# Patient Record
Sex: Male | Born: 1965 | Race: White | Hispanic: No | Marital: Single | Smoking: Never smoker
Health system: Southern US, Community
[De-identification: ages and names within clinical notes are randomized; demographics above are authoritative.]

## PROBLEM LIST (undated history)

## (undated) DIAGNOSIS — B029 Zoster without complications: Secondary | ICD-10-CM

## (undated) DIAGNOSIS — K219 Gastro-esophageal reflux disease without esophagitis: Secondary | ICD-10-CM

## (undated) HISTORY — PX: INGUINAL HERNIA REPAIR: SUR1180

## (undated) HISTORY — PX: STOMACH SURGERY: SHX791

## (undated) HISTORY — DX: Zoster without complications: B02.9

---

## 2010-10-18 ENCOUNTER — Emergency Department: Payer: Self-pay | Admitting: Emergency Medicine

## 2012-05-13 ENCOUNTER — Emergency Department: Payer: Self-pay | Admitting: Emergency Medicine

## 2012-11-21 ENCOUNTER — Emergency Department: Payer: Self-pay | Admitting: Emergency Medicine

## 2012-11-21 LAB — COMPREHENSIVE METABOLIC PANEL
Albumin: 4 g/dL (ref 3.4–5.0)
Alkaline Phosphatase: 78 U/L (ref 50–136)
Anion Gap: 5 — ABNORMAL LOW (ref 7–16)
BUN: 12 mg/dL (ref 7–18)
Bilirubin,Total: 0.9 mg/dL (ref 0.2–1.0)
Calcium, Total: 9 mg/dL (ref 8.5–10.1)
Chloride: 104 mmol/L (ref 98–107)
Creatinine: 1.01 mg/dL (ref 0.60–1.30)
EGFR (Non-African Amer.): >60
Glucose: 94 mg/dL (ref 65–99)
Osmolality: 275 (ref 275–301)
SGPT (ALT): 22 U/L (ref 12–78)
Total Protein: 7.3 g/dL (ref 6.4–8.2)

## 2012-11-21 LAB — URINALYSIS, COMPLETE
Bacteria: NONE SEEN
Bilirubin,UR: NEGATIVE
Glucose,UR: NEGATIVE mg/dL (ref 0–75)
Ketone: NEGATIVE
Leukocyte Esterase: NEGATIVE
Nitrite: NEGATIVE
Ph: 5 (ref 4.5–8.0)
Protein: NEGATIVE
RBC,UR: 1 /HPF (ref 0–5)
Specific Gravity: 1.019 (ref 1.003–1.030)
WBC UR: <1 /HPF (ref 0–5)

## 2012-11-21 LAB — CBC
HGB: 14.5 g/dL (ref 13.0–18.0)
MCHC: 35.2 g/dL (ref 32.0–36.0)
Platelet: 242 10*3/uL (ref 150–440)
RDW: 12.6 % (ref 11.5–14.5)
WBC: 5.5 10*3/uL (ref 3.8–10.6)

## 2013-04-09 ENCOUNTER — Emergency Department: Payer: Self-pay | Admitting: Internal Medicine

## 2014-08-11 ENCOUNTER — Emergency Department
Admission: EM | Admit: 2014-08-11 | Discharge: 2014-08-11 | Disposition: A | Discharge status: Home / Self Care | Payer: Self-pay | Attending: Emergency Medicine | Admitting: Emergency Medicine

## 2014-08-11 ENCOUNTER — Encounter: Payer: Self-pay | Admitting: Emergency Medicine

## 2014-08-11 ENCOUNTER — Emergency Department: Payer: Self-pay

## 2014-08-11 DIAGNOSIS — R197 Diarrhea, unspecified: Secondary | ICD-10-CM | POA: Insufficient documentation

## 2014-08-11 DIAGNOSIS — R1110 Vomiting, unspecified: Secondary | ICD-10-CM | POA: Insufficient documentation

## 2014-08-11 DIAGNOSIS — R1084 Generalized abdominal pain: Secondary | ICD-10-CM | POA: Insufficient documentation

## 2014-08-11 DIAGNOSIS — Z720 Tobacco use: Secondary | ICD-10-CM | POA: Insufficient documentation

## 2014-08-11 LAB — CBC WITH DIFFERENTIAL/PLATELET
Basophils Absolute: 0 10*3/uL (ref 0–0.1)
Basophils Relative: 1 %
Eosinophils Absolute: 0.1 10*3/uL (ref 0–0.7)
Eosinophils Relative: 2 %
HCT: 46.9 % (ref 40.0–52.0)
HEMOGLOBIN: 15.9 g/dL (ref 13.0–18.0)
LYMPHS ABS: 0.9 10*3/uL — AB (ref 1.0–3.6)
Lymphocytes Relative: 20 %
MCH: 32.6 pg (ref 26.0–34.0)
MCHC: 33.9 g/dL (ref 32.0–36.0)
MCV: 96.2 fL (ref 80.0–100.0)
MONOS PCT: 10 %
Monocytes Absolute: 0.4 10*3/uL (ref 0.2–1.0)
NEUTROS PCT: 67 %
Neutro Abs: 2.9 10*3/uL (ref 1.4–6.5)
Platelets: 209 10*3/uL (ref 150–440)
RBC: 4.87 MIL/uL (ref 4.40–5.90)
RDW: 12.4 % (ref 11.5–14.5)
WBC: 4.3 10*3/uL (ref 3.8–10.6)

## 2014-08-11 LAB — BASIC METABOLIC PANEL
Anion gap: 8 (ref 5–15)
BUN: 13 mg/dL (ref 6–20)
CALCIUM: 9.3 mg/dL (ref 8.9–10.3)
CO2: 25 mmol/L (ref 22–32)
Chloride: 105 mmol/L (ref 101–111)
Creatinine, Ser: 1.07 mg/dL (ref 0.61–1.24)
GFR calc Af Amer: >60 mL/min (ref >60)
GLUCOSE: 102 mg/dL — AB (ref 65–99)
Potassium: 4.2 mmol/L (ref 3.5–5.1)
Sodium: 138 mmol/L (ref 135–145)

## 2014-08-11 MED ORDER — FAMOTIDINE 20 MG PO TABS
40.0000 mg | ORAL_TABLET | Freq: Once | ORAL | Status: AC
  Administered 2014-08-11: 40 mg via ORAL

## 2014-08-11 MED ORDER — METOCLOPRAMIDE HCL 10 MG PO TABS: 10.0000 mg | ORAL_TABLET | Freq: Three times a day (TID) | ORAL | Status: DC | PRN

## 2014-08-11 MED ORDER — FAMOTIDINE 20 MG PO TABS
ORAL_TABLET | ORAL | Status: AC
  Administered 2014-08-11: 40 mg via ORAL
  Filled 2014-08-11: qty 2

## 2014-08-11 MED ORDER — FAMOTIDINE 40 MG PO TABS: 40.0000 mg | ORAL_TABLET | Freq: Every evening | ORAL | Status: DC

## 2014-08-11 NOTE — Discharge Instructions (Signed)
Abdominal Pain Many things can cause abdominal pain. Usually, abdominal pain is not caused by a disease and will improve without treatment. It can often be observed and treated at home. Your health care provider will do a physical exam and possibly order blood tests and X-rays to help determine the seriousness of your pain. However, in many cases, more time must pass before a clear cause of the pain can be found. Before that point, your health care provider may not know if you need more testing or further treatment. HOME CARE INSTRUCTIONS  Monitor your abdominal pain for any changes. The following actions may help to alleviate any discomfort you are experiencing:  Only take over-the-counter or prescription medicines as directed by your health care provider.  Do not take laxatives unless directed to do so by your health care provider.  Try a clear liquid diet (broth, tea, or water) as directed by your health care provider. Slowly move to a bland diet as tolerated. SEEK MEDICAL CARE IF:  You have unexplained abdominal pain.  You have abdominal pain associated with nausea or diarrhea.  You have pain when you urinate or have a bowel movement.  You experience abdominal pain that wakes you in the night.  You have abdominal pain that is worsened or improved by eating food.  You have abdominal pain that is worsened with eating fatty foods.  You have a fever. SEEK IMMEDIATE MEDICAL CARE IF:   Your pain does not go away within 2 hours.  You keep throwing up (vomiting).  Your pain is felt only in portions of the abdomen, such as the right side or the left lower portion of the abdomen.  You pass bloody or black tarry stools. MAKE SURE YOU:  Understand these instructions.   Will watch your condition.   Will get help right away if you are not doing well or get worse.  Document Released: 12/28/2004 Document Revised: 03/25/2013 Document Reviewed: 11/27/2012 [facility] Patient Information  2015 [place], [place]. This information is not intended to replace advice given to you by your health care provider. Make sure you discuss any questions you have with your health care provider.  Diarrhea Diarrhea is watery poop (stool). It can make you feel weak, tired, thirsty, or give you a dry mouth (signs of dehydration). Watery poop is a sign of another problem, most often an infection. It often lasts 2-3 days. It can last longer if it is a sign of something serious. Take care of yourself as told by your doctor. HOME CARE   Drink 1 cup (8 ounces) of fluid each time you have watery poop.  Do not drink the following fluids:  Those that contain simple sugars (fructose, glucose, galactose, lactose, sucrose, maltose).  Sports drinks.  Fruit juices.  Whole milk products.  Sodas.  Drinks with caffeine (coffee, tea, soda) or alcohol.  Oral rehydration solution may be used if the doctor says it is okay. You may make your own solution. Follow this recipe:   - teaspoon table salt.   teaspoon baking soda.   teaspoon salt substitute containing potassium chloride.  1 tablespoons sugar.  1 liter (34 ounces) of water.  Avoid the following foods:  High fiber foods, such as raw fruits and vegetables.  Nuts, seeds, and whole grain breads and cereals.   Those that are sweetened with sugar alcohols (xylitol, sorbitol, mannitol).  Try eating the following foods:  Starchy foods, such as rice, toast, pasta, low-sugar cereal, oatmeal, baked potatoes, crackers, and bagels.  Bananas.  Applesauce.  Eat probiotic-rich foods, such as yogurt and milk products that are fermented.  Wash your hands well after each time you have watery poop.  Only take medicine as told by your doctor.  Take a warm bath to help lessen burning or pain from having watery poop. GET HELP RIGHT AWAY IF:   You cannot drink fluids without throwing up (vomiting).  You keep throwing up.  You have blood in your  poop, or your poop looks black and tarry.  You do not pee (urinate) in 6-8 hours, or there is only a small amount of very dark pee.  You have belly (abdominal) pain that gets worse or stays in the same spot (localizes).  You are weak, dizzy, confused, or light-headed.  You have a very bad headache.  Your watery poop gets worse or does not get better.  You have a fever or lasting symptoms for more than 2-3 days.  You have a fever and your symptoms suddenly get worse. MAKE SURE YOU:   Understand these instructions.  Will watch your condition.  Will get help right away if you are not doing well or get worse. Document Released: 09/06/2007 Document Revised: 08/04/2013 Document Reviewed: 11/26/2011 [facility] Patient Information 2015 [place], [place]. This information is not intended to replace advice given to you by your health care provider. Make sure you discuss any questions you have with your health care provider.  Nausea and Vomiting Nausea is a sick feeling that often comes before throwing up (vomiting). Vomiting is a reflex where stomach contents come out of your mouth. Vomiting can cause severe loss of body fluids (dehydration). Children and elderly adults can become dehydrated quickly, especially if they also have diarrhea. Nausea and vomiting are symptoms of a condition or disease. It is important to find the cause of your symptoms. CAUSES   Direct irritation of the stomach lining. This irritation can result from increased acid production (gastroesophageal reflux disease), infection, food poisoning, taking certain medicines (such as nonsteroidal anti-inflammatory drugs), alcohol use, or tobacco use.  Signals from the brain.These signals could be caused by a headache, heat exposure, an inner ear disturbance, increased pressure in the brain from injury, infection, a tumor, or a concussion, pain, emotional stimulus, or metabolic problems.  An obstruction in the gastrointestinal tract  (bowel obstruction).  Illnesses such as diabetes, hepatitis, gallbladder problems, appendicitis, kidney problems, cancer, sepsis, atypical symptoms of a heart attack, or eating disorders.  Medical treatments such as chemotherapy and radiation.  Receiving medicine that makes you sleep (general anesthetic) during surgery. DIAGNOSIS Your caregiver may ask for tests to be done if the problems do not improve after a few days. Tests may also be done if symptoms are severe or if the reason for the nausea and vomiting is not clear. Tests may include:  Urine tests.  Blood tests.  Stool tests.  Cultures (to look for evidence of infection).  X-rays or other imaging studies. Test results can help your caregiver make decisions about treatment or the need for additional tests. TREATMENT You need to stay well hydrated. Drink frequently but in small amounts.You may wish to drink water, sports drinks, clear broth, or eat frozen ice pops or gelatin dessert to help stay hydrated.When you eat, eating slowly may help prevent nausea.There are also some antinausea medicines that may help prevent nausea. HOME CARE INSTRUCTIONS   Take all medicine as directed by your caregiver.  If you do not have an appetite, do not force yourself  to eat. However, you must continue to drink fluids.  If you have an appetite, eat a normal diet unless your caregiver tells you differently.  Eat a variety of complex carbohydrates (rice, wheat, potatoes, bread), lean meats, yogurt, fruits, and vegetables.  Avoid high-fat foods because they are more difficult to digest.  Drink enough water and fluids to keep your urine clear or pale yellow.  If you are dehydrated, ask your caregiver for specific rehydration instructions. Signs of dehydration may include:  Severe thirst.  Dry lips and mouth.  Dizziness.  Dark urine.  Decreasing urine frequency and amount.  Confusion.  Rapid breathing or pulse. SEEK IMMEDIATE  MEDICAL CARE IF:   You have blood or brown flecks (like coffee grounds) in your vomit.  You have black or bloody stools.  You have a severe headache or stiff neck.  You are confused.  You have severe abdominal pain.  You have chest pain or trouble breathing.  You do not urinate at least once every 8 hours.  You develop cold or clammy skin.  You continue to vomit for longer than 24 to 48 hours.  You have a fever. MAKE SURE YOU:   Understand these instructions.  Will watch your condition.  Will get help right away if you are not doing well or get worse. Document Released: 03/20/2005 Document Revised: 06/12/2011 Document Reviewed: 08/17/2010 [facility] Patient Information 2015 [place], [place]. This information is not intended to replace advice given to you by your health care provider. Make sure you discuss any questions you have with your health care provider.

## 2014-08-11 NOTE — ED Provider Notes (Addendum)
[facility] Emergency Department Provider Note  ____________________________________________  Time seen: Approximately 11 PM  I have reviewed the triage vital signs and the nursing notes.   HISTORY  Chief Complaint Hematemesis    HPI Jeremy Spence is a 49 y.o. male who presents with 3 episodes of nausea and vomiting this morning. He said his first episode had a small amount of blood streaking. He describes about a teaspoon. He did had 2 more episodes of vomitus without any blood. He is also reporting loose stool, but no blood in the stool. He denies any belly pain. Says that he has had similar episodes of vomiting over the past 3-4 months. However, he says that he usually just vomits once. This morning he became concerned because of 3 episodes of vomiting. He says that he does drink on the weekends but did not drink this past weekend. Does not have a primary care doctor and has not had an endoscopy. [facility] and told that he had "2 linings in his stomach" whereas other people have 3. He said that he had a surgery to repair this because of a mass that was resulted in his left thigh and left lower quadrant.Denies any nausea at this time.   History reviewed. No pertinent past medical history.  There are no active problems to display for this patient.   History reviewed. No pertinent past surgical history.  No current outpatient prescriptions on file.  Allergies Review of patient's allergies indicates not on file.  No family history on file.  Social History History  Substance Use Topics  . Smoking status: Current Every Day Smoker  . Smokeless tobacco: Not on file  . Alcohol Use: Yes    Review of Systems Constitutional: No fever/chills Eyes: No visual changes. ENT: No sore throat. Cardiovascular: Denies chest pain. Respiratory: Denies shortness of breath. Gastrointestinal: No constipation. Genitourinary: Negative for  dysuria. Says has had an intermittent left groin hernia which is able to push back in over the past about 6 months. Musculoskeletal: Negative for back pain. Skin: Negative for rash. Neurological: Negative for headaches, focal weakness or numbness.  10-point ROS otherwise negative.  ____________________________________________   PHYSICAL EXAM:  VITAL SIGNS: ED Triage Vitals  Enc Vitals Group     BP 08/11/14 1002 126/73 mmHg     Pulse Rate 08/11/14 1002 55     Resp 08/11/14 1002 20     Temp 08/11/14 1002 98.2 F (36.8 C)     Temp Source 08/11/14 1002 Oral     SpO2 08/11/14 1002 97 %     Weight 08/11/14 0929 159 lb (72.122 kg)     Height 08/11/14 0929 5\' 5"  (1.651 m)     Head Cir --      Peak Flow --      Pain Score 08/11/14 0930 3     Pain Loc --      Pain Edu? --      Excl. in GC? --     Constitutional: Alert and oriented. Well appearing and in no acute distress. Eyes: Conjunctivae are normal. PERRL. EOMI. Head: Atraumatic. Nose: No congestion/rhinnorhea. Mouth/Throat: Mucous membranes are moist.  Oropharynx non-erythematous. Neck: No stridor.   Cardiovascular: Normal rate, regular rhythm. Grossly normal heart sounds.  Good peripheral circulation. Respiratory: Normal respiratory effort.  No retractions. Lungs CTAB. Gastrointestinal: Soft with mild tenderness to palpation throughout negative Murphy sign. No distention. No abdominal bruits. No CVA tenderness. Genitourinary: Left  groin with small bulge over the direct inguinal area. Soft and easily reducible without any tenderness. Musculoskeletal: No lower extremity tenderness nor edema.  No joint effusions. Neurologic:  Normal speech and language. No gross focal neurologic deficits are appreciated. Speech is normal. No gait instability. Skin:  Skin is warm, dry and intact. No rash noted. Psychiatric: Mood and affect are normal. Speech and behavior are normal.  ____________________________________________   LABS (all  labs ordered are listed, but only abnormal results are displayed)  Labs Reviewed  CBC WITH DIFFERENTIAL/PLATELET - Abnormal; Notable for the following:    Lymphs Abs 0.9 (*)    All other components within normal limits  BASIC METABOLIC PANEL - Abnormal; Notable for the following:    Glucose, Bld 102 (*)    All other components within normal limits   ____________________________________________  EKG   ____________________________________________  RADIOLOGY  Chest x-ray NAD. ____________________________________________   PROCEDURES    ____________________________________________   INITIAL IMPRESSION / ASSESSMENT AND PLAN / ED COURSE  Pertinent labs & imaging results that were available during my care of the patient were reviewed by me and considered in my medical decision making (see chart for details).  Unclear cause of patient's nausea and vomiting and diarrhea. Counseled patient and he will need to follow-up with a primary care doctor for further workup of this. He will likely need an endoscopy because of the persistence of his symptoms. I will start him on an antacid. I will give him follow-up with a true clinic because the patient does not have insurance. I told him also to discuss referral to a surgeon for his hernia with the primary care doctor. His labs are reassuring and the patient is now asymptomatic. ____________________________________________   FINAL CLINICAL IMPRESSION(S) / ED DIAGNOSES  Abdominal pain. Hematemesis, resolved. Acute, initial visit.    Myrna Blazeravid Matthew Jacere Pangborn, MD 08/11/14 1250  Unlikely to be appendicitis or emergent intra-abdominal process at this time. Normal white count. Mild tenderness throughout.  Myrna Blazeravid Matthew Johne Buckle, MD 08/11/14 1254  Patient was counseled to talk with his primary care doctor about further workup for his nausea and vomiting. He will also discuss hernia repair.  Myrna Blazeravid Matthew Kaisey Huseby, MD 08/11/14 1256

## 2014-08-11 NOTE — ED Notes (Signed)
Pt reports that he started vomiting this am and he seen a little bit of blood in it. No N/V seen at this time.

## 2014-09-02 ENCOUNTER — Emergency Department
Admission: EM | Admit: 2014-09-02 | Discharge: 2014-09-02 | Disposition: A | Discharge status: Home / Self Care | Payer: Self-pay | Attending: Emergency Medicine | Admitting: Emergency Medicine

## 2014-09-02 ENCOUNTER — Encounter: Payer: Self-pay | Admitting: Emergency Medicine

## 2014-09-02 DIAGNOSIS — Z720 Tobacco use: Secondary | ICD-10-CM | POA: Insufficient documentation

## 2014-09-02 DIAGNOSIS — Z79899 Other long term (current) drug therapy: Secondary | ICD-10-CM | POA: Insufficient documentation

## 2014-09-02 DIAGNOSIS — K920 Hematemesis: Secondary | ICD-10-CM | POA: Insufficient documentation

## 2014-09-02 LAB — URINALYSIS COMPLETE WITH MICROSCOPIC (ARMC ONLY)
BACTERIA UA: NONE SEEN
Bilirubin Urine: NEGATIVE
Glucose, UA: NEGATIVE mg/dL
HGB URINE DIPSTICK: NEGATIVE
KETONES UR: NEGATIVE mg/dL
Leukocytes, UA: NEGATIVE
Nitrite: NEGATIVE
Protein, ur: 30 mg/dL — AB
SQUAMOUS EPITHELIAL / LPF: NONE SEEN
Specific Gravity, Urine: 1.021 (ref 1.005–1.030)
pH: 7 (ref 5.0–8.0)

## 2014-09-02 LAB — COMPREHENSIVE METABOLIC PANEL
ALT: 19 U/L (ref 17–63)
AST: 26 U/L (ref 15–41)
Albumin: 4.3 g/dL (ref 3.5–5.0)
Alkaline Phosphatase: 77 U/L (ref 38–126)
Anion gap: 8 (ref 5–15)
BILIRUBIN TOTAL: 2.2 mg/dL — AB (ref 0.3–1.2)
BUN: 12 mg/dL (ref 6–20)
CALCIUM: 9.4 mg/dL (ref 8.9–10.3)
CHLORIDE: 103 mmol/L (ref 101–111)
CO2: 28 mmol/L (ref 22–32)
CREATININE: 1.05 mg/dL (ref 0.61–1.24)
GFR calc Af Amer: >60 mL/min (ref >60)
GFR calc non Af Amer: >60 mL/min (ref >60)
GLUCOSE: 105 mg/dL — AB (ref 65–99)
Potassium: 3.8 mmol/L (ref 3.5–5.1)
Sodium: 139 mmol/L (ref 135–145)
Total Protein: 7.7 g/dL (ref 6.5–8.1)

## 2014-09-02 LAB — CBC WITH DIFFERENTIAL/PLATELET
BASOS ABS: 0.1 10*3/uL (ref 0–0.1)
BASOS PCT: 1 %
Eosinophils Absolute: 0.1 10*3/uL (ref 0–0.7)
Eosinophils Relative: 2 %
HCT: 46.3 % (ref 40.0–52.0)
HEMOGLOBIN: 15.8 g/dL (ref 13.0–18.0)
Lymphocytes Relative: 16 %
Lymphs Abs: 0.9 10*3/uL — ABNORMAL LOW (ref 1.0–3.6)
MCH: 32.6 pg (ref 26.0–34.0)
MCHC: 34.2 g/dL (ref 32.0–36.0)
MCV: 95.5 fL (ref 80.0–100.0)
MONO ABS: 0.5 10*3/uL (ref 0.2–1.0)
Monocytes Relative: 9 %
NEUTROS PCT: 72 %
Neutro Abs: 4.2 10*3/uL (ref 1.4–6.5)
Platelets: 231 10*3/uL (ref 150–440)
RBC: 4.85 MIL/uL (ref 4.40–5.90)
RDW: 12.3 % (ref 11.5–14.5)
WBC: 5.7 10*3/uL (ref 3.8–10.6)

## 2014-09-02 LAB — LIPASE, BLOOD: Lipase: 32 U/L (ref 22–51)

## 2014-09-02 MED ORDER — PANTOPRAZOLE SODIUM 40 MG PO TBEC
40.0000 mg | DELAYED_RELEASE_TABLET | Freq: Once | ORAL | Status: AC
  Administered 2014-09-02: 40 mg via ORAL

## 2014-09-02 MED ORDER — OMEPRAZOLE 40 MG PO CPDR: 40.0000 mg | DELAYED_RELEASE_CAPSULE | Freq: Every day | ORAL | Status: DC

## 2014-09-02 MED ORDER — PANTOPRAZOLE SODIUM 40 MG PO TBEC
DELAYED_RELEASE_TABLET | ORAL | Status: AC
  Filled 2014-09-02: qty 1

## 2014-09-02 NOTE — ED Notes (Signed)
States he had 2 episodes of vomiting today with some blood noted

## 2014-09-02 NOTE — Discharge Instructions (Signed)
Hematemesis Stop taking your pepcid/famotidine.  Begin taking omeprazole given to you today.   This condition is the vomiting of blood. CAUSES  This can happen if you have a peptic ulcer or an irritation of the throat, stomach, or small bowel. Vomiting over and over again or swallowing blood from a nosebleed, coughing or facial injury can also result in bloody vomit. Anti-inflammatory pain medicines are a common cause of this potentially dangerous condition. The most serious causes of vomiting blood include:  Ulcers (a bacteria called H. pylori is common cause of ulcers).  Clotting problems.  Alcoholism.  Cirrhosis. TREATMENT  Treatment depends on the cause and the severity of the bleeding. Small amounts of blood streaks in the vomit is not the same as vomiting large amounts of bloody or dark, coffee grounds-like material. Weakness, fainting, dehydration, anemia, and continued alcohol or drug use increase the risk. Examination may include blood, vomit, or stool tests. The presence of bloody or dark stool that tests positive for blood (Hemoccult) means the bleeding has been going on for some time. Endoscopy and imaging studies may be done. Emergency treatment may include:  IV medicines or fluids.  Blood transfusions.  Surgery. Hospital care is required for high risk patients or when IV fluids or blood is needed. Upper GI bleeding can cause shock and death if not controlled. HOME CARE INSTRUCTIONS   Your treatment does not require hospital care at this time.  Remain at rest until your condition improves.  Drink clear liquids as tolerated.  Avoid:  Alcohol.  Nicotine.  Aspirin.  Any other anti-inflammatory medicine (ibuprofen, naproxen, and many others).  Medications to suppress stomach acid or vomiting may be needed. Take all your medicine as prescribed.  Be sure to see your caregiver for follow-up as recommended. SEEK IMMEDIATE MEDICAL CARE IF:   You have repeated  vomiting, dehydration, fainting, or extreme weakness.  You are vomiting large amounts of bloody or dark material.  You pass large, dark or bloody stools. Document Released: 04/27/2004 Document Revised: 06/12/2011 Document Reviewed: 05/13/2008 [facility] Patient Information 2015 [place], [place]. This information is not intended to replace advice given to you by your health care provider. Make sure you discuss any questions you have with your health care provider.

## 2014-09-02 NOTE — ED Notes (Signed)
Pt arrives with complaints of bloody emesis, pt states he was seen ER about 1 month ago for vomiting blood, pt states bright and dark red blood, pt also states some bloody stool, pt states weakness, pt awake and alert during assessment in no distress

## 2014-09-02 NOTE — ED Provider Notes (Signed)
[facility] Emergency Department Provider Note  ____________________________________________  Time seen: Approximately 5 PM  I have reviewed the triage vital signs and the nursing notes.   HISTORY  Chief Complaint Emesis    HPI Jeremy Spence is a 49 y.o. male without any pertinent medical history presents with 2 episodes of vomiting blood this morning. He says that he has emesis like this once a month for the past several months. Was seen here several weeks ago in the emergency department and given Reglan and Pepcid which provided some relief but says that his vomiting returned today. He also said that he had one episode of bright red blood per rectum this past Friday. He has had normal bowel movements since. He describes the vomitus is having several quarter sized spots of blood. He denies having any clots. At this time he denies any belly pain nausea or vomiting. He says this episode is identical to previous months. He has not called for follow-up to schedule an appointment over the past several weeks. He is accompanied here by his mother today. He says he has been compliant with his medications. The patient does say that he has 12-18 beers on the weekends but does not drink in the week.   History reviewed. No pertinent past medical history.  There are no active problems to display for this patient.   History reviewed. No pertinent past surgical history.  Current Outpatient Rx  Name  Route  Sig  Dispense  Refill  . famotidine (PEPCID) 40 MG tablet   Oral   Take 1 tablet (40 mg total) by mouth every evening.   30 tablet   1   . metoCLOPramide (REGLAN) 10 MG tablet   Oral   Take 1 tablet (10 mg total) by mouth every 8 (eight) hours as needed for nausea or vomiting.   12 tablet   1     Allergies Review of patient's allergies indicates not on file.  No family history on file.  Social History History  Substance Use Topics  . Smoking status:  Current Every Day Smoker  . Smokeless tobacco: Not on file  . Alcohol Use: Yes    Review of Systems Constitutional: No fever/chills Eyes: No visual changes. ENT: No sore throat. Cardiovascular: Denies chest pain. Respiratory: Denies shortness of breath. Gastrointestinal: As above  Genitourinary: Negative for dysuria. Musculoskeletal: Negative for back pain. Skin: Negative for rash. Neurological: Negative for headaches, focal weakness or numbness.  10-point ROS otherwise negative.  ____________________________________________   PHYSICAL EXAM:  VITAL SIGNS: ED Triage Vitals  Enc Vitals Group     BP 09/02/14 1537 127/87 mmHg     Pulse Rate 09/02/14 1537 61     Resp 09/02/14 1537 20     Temp 09/02/14 1537 98.1 F (36.7 C)     Temp Source 09/02/14 1537 Oral     SpO2 09/02/14 1537 98 %     Weight 09/02/14 1537 205 lb (92.987 kg)     Height 09/02/14 1537  (1.702 m)     Head Cir --      Peak Flow --      Pain Score 09/02/14 1539 3     Pain Loc --      Pain Edu? --      Excl. in GC? --     Constitutional: Alert and oriented. Well appearing and in no acute distress. Eyes: Conjunctivae are normal. PERRL. EOMI. Head: Atraumatic. Nose: No congestion/rhinnorhea. Mouth/Throat: Mucous membranes  are moist.  Oropharynx non-erythematous. Neck: No stridor.   Cardiovascular: Normal rate, regular rhythm. Grossly normal heart sounds.  Good peripheral circulation. Respiratory: Normal respiratory effort.  No retractions. Lungs CTAB. Gastrointestinal: Soft and nontender. No distention. No abdominal bruits. No CVA tenderness. On rectal exam there are no external lesions. There is heme-negative brown stool. Musculoskeletal: No lower extremity tenderness nor edema.  No joint effusions. Neurologic:  Normal speech and language. No gross focal neurologic deficits are appreciated. Speech is normal. No gait instability. Skin:  Skin is warm, dry and intact. No rash noted. Psychiatric: Mood  and affect are normal. Speech and behavior are normal.  ____________________________________________   LABS (all labs ordered are listed, but only abnormal results are displayed)  Labs Reviewed  CBC WITH DIFFERENTIAL/PLATELET - Abnormal; Notable for the following:    Lymphs Abs 0.9 (*)    All other components within normal limits  COMPREHENSIVE METABOLIC PANEL - Abnormal; Notable for the following:    Glucose, Bld 105 (*)    Total Bilirubin 2.2 (*)    All other components within normal limits  URINALYSIS COMPLETEWITH MICROSCOPIC ([facility] ONLY) - Abnormal; Notable for the following:    Color, Urine YELLOW (*)    APPearance CLEAR (*)    Protein, ur 30 (*)    All other components within normal limits  LIPASE, BLOOD   ____________________________________________  EKG   ____________________________________________  RADIOLOGY   ____________________________________________   PROCEDURES    ____________________________________________   INITIAL IMPRESSION / ASSESSMENT AND PLAN / ED COURSE  Pertinent labs & imaging results that were available during my care of the patient were reviewed by me and considered in my medical decision making (see chart for details).  ----------------------------------------- 5:46 PM on 09/02/2014 -----------------------------------------  Patient is resting comfortably in bed and has no further episodes of nausea vomiting or bloody stool in the emergency department. I will switch his medication from Pepcid to omeprazole. I counseled the patient and his mother that he must follow-up with the GI doctor as well as a primary care doctor for further evaluation. I told him that the medicine will help to reduce the acid in his stomach but is no substitute for workup by gastroenterologist will likely require a endoscopy. Told him that if he does not follow-up in the office he will likely continue to require return visit to the emergency department because  the issue is unlikely to be completely resolved. I also counseled him that if he does not follow-up in the office that it is possible that his condition could worsen.  Patient does not have any stigmata of liver disease. However he does have an elevated total bilirubin. I'm concerned that he does have some cholestasis and they could be having early signs of cirrhosis. However, his labs are normal and he has not vomited further in the emergency department. I feel that he is appropriate for outpatient treatment. I also counseled him to stop drinking as this would likely also help with his vomiting episodes. The patient and his mother understand the counseling and are willing to comply with the plan to follow-up in the office. ____________________________________________   FINAL CLINICAL IMPRESSION(S) / ED DIAGNOSES  Hematemesis. Acute, return visit.    Myrna Blazeravid Matthew Schaevitz, MD 09/02/14 [phone]

## 2015-01-05 ENCOUNTER — Emergency Department: Payer: Self-pay

## 2015-01-05 ENCOUNTER — Encounter: Payer: Self-pay | Admitting: Emergency Medicine

## 2015-01-05 ENCOUNTER — Emergency Department
Admission: EM | Admit: 2015-01-05 | Discharge: 2015-01-05 | Disposition: A | Discharge status: Home / Self Care | Payer: Self-pay | Attending: Emergency Medicine | Admitting: Emergency Medicine

## 2015-01-05 DIAGNOSIS — Z87891 Personal history of nicotine dependence: Secondary | ICD-10-CM | POA: Insufficient documentation

## 2015-01-05 DIAGNOSIS — F419 Anxiety disorder, unspecified: Secondary | ICD-10-CM | POA: Insufficient documentation

## 2015-01-05 DIAGNOSIS — Z79899 Other long term (current) drug therapy: Secondary | ICD-10-CM | POA: Insufficient documentation

## 2015-01-05 DIAGNOSIS — K529 Noninfective gastroenteritis and colitis, unspecified: Secondary | ICD-10-CM | POA: Insufficient documentation

## 2015-01-05 LAB — COMPREHENSIVE METABOLIC PANEL
ALBUMIN: 4.3 g/dL (ref 3.5–5.0)
ALT: 28 U/L (ref 17–63)
AST: 23 U/L (ref 15–41)
Alkaline Phosphatase: 73 U/L (ref 38–126)
Anion gap: 8 (ref 5–15)
BUN: 15 mg/dL (ref 6–20)
CHLORIDE: 104 mmol/L (ref 101–111)
CO2: 26 mmol/L (ref 22–32)
Calcium: 9.6 mg/dL (ref 8.9–10.3)
Creatinine, Ser: 1 mg/dL (ref 0.61–1.24)
GFR calc Af Amer: >60 mL/min (ref >60)
GFR calc non Af Amer: >60 mL/min (ref >60)
GLUCOSE: 113 mg/dL — AB (ref 65–99)
Potassium: 3.7 mmol/L (ref 3.5–5.1)
Sodium: 138 mmol/L (ref 135–145)
Total Bilirubin: 2.2 mg/dL — ABNORMAL HIGH (ref 0.3–1.2)
Total Protein: 7.5 g/dL (ref 6.5–8.1)

## 2015-01-05 LAB — CBC
HEMATOCRIT: 46 % (ref 40.0–52.0)
Hemoglobin: 16.1 g/dL (ref 13.0–18.0)
MCH: 31.7 pg (ref 26.0–34.0)
MCHC: 35 g/dL (ref 32.0–36.0)
MCV: 90.8 fL (ref 80.0–100.0)
Platelets: 197 10*3/uL (ref 150–440)
RBC: 5.06 MIL/uL (ref 4.40–5.90)
RDW: 12.5 % (ref 11.5–14.5)
WBC: 5.5 10*3/uL (ref 3.8–10.6)

## 2015-01-05 LAB — TROPONIN I

## 2015-01-05 LAB — URINALYSIS COMPLETE WITH MICROSCOPIC (ARMC ONLY)
BACTERIA UA: NONE SEEN
Bilirubin Urine: NEGATIVE
GLUCOSE, UA: NEGATIVE mg/dL
HGB URINE DIPSTICK: NEGATIVE
Ketones, ur: NEGATIVE mg/dL
LEUKOCYTES UA: NEGATIVE
Nitrite: NEGATIVE
Protein, ur: NEGATIVE mg/dL
Specific Gravity, Urine: 1.02 (ref 1.005–1.030)
pH: 5 (ref 5.0–8.0)

## 2015-01-05 LAB — LIPASE, BLOOD: LIPASE: 23 U/L (ref 22–51)

## 2015-01-05 MED ORDER — ONDANSETRON HCL 4 MG PO TABS: 4.0000 mg | ORAL_TABLET | Freq: Every day | ORAL | Status: DC | PRN

## 2015-01-05 MED ORDER — IOHEXOL 300 MG/ML  SOLN
100.0000 mL | Freq: Once | INTRAMUSCULAR | Status: AC | PRN
  Administered 2015-01-05: 100 mL via INTRAVENOUS

## 2015-01-05 MED ORDER — METRONIDAZOLE 500 MG PO TABS: 500.0000 mg | ORAL_TABLET | Freq: Two times a day (BID) | ORAL | Status: DC

## 2015-01-05 MED ORDER — SODIUM CHLORIDE 0.9 % IV SOLN
1000.0000 mL | Freq: Once | INTRAVENOUS | Status: AC
  Administered 2015-01-05: 1000 mL via INTRAVENOUS

## 2015-01-05 MED ORDER — ONDANSETRON HCL 4 MG/2ML IJ SOLN
4.0000 mg | Freq: Once | INTRAMUSCULAR | Status: AC
  Administered 2015-01-05: 4 mg via INTRAVENOUS
  Filled 2015-01-05: qty 2

## 2015-01-05 MED ORDER — CIPROFLOXACIN HCL 500 MG PO TABS: 500.0000 mg | ORAL_TABLET | Freq: Two times a day (BID) | ORAL | Status: DC

## 2015-01-05 NOTE — ED Provider Notes (Signed)
[facility] Emergency Department Provider Note  ____________________________________________  Time seen: 7:30 AM  I have reviewed the triage vital signs and the nursing notes.   HISTORY  Chief Complaint Vomiting; Diarrhea; and Abdominal Pain    HPI Jeremy Spence is a 49 y.o. male who presents with nausea vomiting and abdominal pain and diarrhea. He reports he has had this over the last 4 months intermittently. He apparently has a GI appointment with [facility] scheduled in December. He has been seen twice in the emergency department over the last few months for similar symptoms. Today he complains of primarily nausea and vomiting over the last 24 hours along with diarrhea and crampy moderate right lower quadrant abdominal pain.He is unable to tolerate by mouth's at this time     History reviewed. No pertinent past medical history.  There are no active problems to display for this patient.   Past Surgical History  Procedure Laterality Date  . Stomach surgery      Current Outpatient Rx  Name  Route  Sig  Dispense  Refill  . omeprazole (PRILOSEC) 40 MG capsule   Oral   Take 1 capsule (40 mg total) by mouth daily.   30 capsule   1   . promethazine (PHENERGAN) 25 MG tablet   Oral   Take 25 mg by mouth every 6 (six) hours as needed for nausea or vomiting.         . metoCLOPramide (REGLAN) 10 MG tablet   Oral   Take 1 tablet (10 mg total) by mouth every 8 (eight) hours as needed for nausea or vomiting. Patient not taking: Reported on 01/05/2015   12 tablet   1     Allergies Review of patient's allergies indicates no known allergies.  No family history on file.  Social History Social History  Substance Use Topics  . Smoking status: Former Games developer  . Smokeless tobacco: None  . Alcohol Use: No    Review of Systems  Constitutional: Negative for fever. Eyes: Negative for visual changes. ENT: Negative for sore throat Cardiovascular:  Negative for chest pain. Respiratory: Negative for shortness of breath. Gastrointestinal: Positive for vomiting and diarrhea and abdominal pain Genitourinary: Negative for dysuria. Musculoskeletal: Negative for back pain. Skin: Negative for rash. Neurological: Negative for headaches or focal weakness Psychiatric: Mild anxiety    ____________________________________________   PHYSICAL EXAM:  VITAL SIGNS: ED Triage Vitals  Enc Vitals Group     BP 01/05/15 0456 130/75 mmHg     Pulse Rate 01/05/15 0456 60     Resp 01/05/15 0456 20     Temp 01/05/15 0456 97.9 F (36.6 C)     Temp Source 01/05/15 0456 Oral     SpO2 01/05/15 0456 95 %     Weight 01/05/15 0456 190 lb (86.183 kg)     Height 01/05/15 0456  (1.702 m)     Head Cir --      Peak Flow --      Pain Score 01/05/15 0455 5     Pain Loc --      Pain Edu? --      Excl. in GC? --      Constitutional: Alert and oriented. Well appearing and in no distress. Eyes: Conjunctivae are normal.  ENT   Head: Normocephalic and atraumatic.   Mouth/Throat: Mucous membranes are slightly dry. Cardiovascular: Normal rate, regular rhythm. Normal and symmetric distal pulses are present in all extremities. No murmurs, rubs, or gallops.  Respiratory: Normal respiratory effort without tachypnea nor retractions. Breath sounds are clear and equal bilaterally.  Gastrointestinal: Moderate tenderness to palpation in the right upper and lower quadrants. Nonsurgical abdomen. No distention. There is no CVA tenderness. Genitourinary: deferred Musculoskeletal: Nontender with normal range of motion in all extremities. No lower extremity tenderness nor edema. Neurologic:  Normal speech and language. No gross focal neurologic deficits are appreciated. Skin:  Skin is warm, dry and intact. No rash noted. Psychiatric: Mood and affect are normal. Patient exhibits appropriate insight and judgment.  ____________________________________________     LABS (pertinent positives/negatives)  Labs Reviewed  COMPREHENSIVE METABOLIC PANEL - Abnormal; Notable for the following:    Glucose, Bld 113 (*)    Total Bilirubin 2.2 (*)    All other components within normal limits  URINALYSIS COMPLETEWITH MICROSCOPIC ([facility] ONLY) - Abnormal; Notable for the following:    Color, Urine YELLOW (*)    APPearance CLEAR (*)    Squamous Epithelial / LPF 0-5 (*)    All other components within normal limits  LIPASE, BLOOD  CBC  TROPONIN I    ____________________________________________   EKG  ED ECG REPORT I, Jene Every, the attending physician, personally viewed and interpreted this ECG.  Date: 01/05/2015 EKG Time: 5:02 AM Rate: 51 Rhythm: Sinus bradycardia QRS Axis: normal Intervals: normal ST/T Wave abnormalities: normal Conduction Disutrbances: none Narrative Interpretation: unremarkable   ____________________________________________    RADIOLOGY I have personally reviewed any xrays that were ordered on this patient: CT shows possible enteritis/colitis  ____________________________________________   PROCEDURES  Procedure(s) performed: none  Critical Care performed: none  ____________________________________________   INITIAL IMPRESSION / ASSESSMENT AND PLAN / ED COURSE  Pertinent labs & imaging results that were available during my care of the patient were reviewed by me and considered in my medical decision making (see chart for details).  The patient presents again with gastritis like symptoms but today with significant right-sided abdominal pain. His labs are essentially unchanged from a few months ago. We will give fluids, nausea medication, obtain CT abdomen and pelvis and reevaluate.  CT shows mild enteritis/colitis. Patient well-appearing. Rechecked his heart rate myself and it was 60 with a stable blood pressure. I discussed with the patient's family that we will start him on antibiotics, Zofran and  encourage fluid intake. He does have gastroenterology follow-up and I encouraged them to try to move forward as this is a relatively chronic issue. Return precautions discussed with patient  ____________________________________________   FINAL CLINICAL IMPRESSION(S) / ED DIAGNOSES  Final diagnoses:  Gastroenteritis     Jene Every, MD 01/05/15 1156

## 2015-01-05 NOTE — Discharge Instructions (Signed)

## 2015-01-05 NOTE — ED Notes (Signed)
Patient ambulatory to triage with steady gait, without difficulty or distress noted; pt reports N/V/D since Sunday with mid abd pain; st "stomach issues for last 16 weeks"

## 2015-01-18 ENCOUNTER — Encounter: Payer: Self-pay | Admitting: Emergency Medicine

## 2015-01-18 ENCOUNTER — Inpatient Hospital Stay
Admission: EM | Admit: 2015-01-18 | Discharge: 2015-01-27 | DRG: 373 | Disposition: A | Discharge status: Home / Self Care | Payer: Self-pay | Attending: Internal Medicine | Admitting: Internal Medicine

## 2015-01-18 ENCOUNTER — Emergency Department: Payer: Self-pay

## 2015-01-18 DIAGNOSIS — Z8249 Family history of ischemic heart disease and other diseases of the circulatory system: Secondary | ICD-10-CM

## 2015-01-18 DIAGNOSIS — K4090 Unilateral inguinal hernia, without obstruction or gangrene, not specified as recurrent: Secondary | ICD-10-CM | POA: Diagnosis present

## 2015-01-18 DIAGNOSIS — A0471 Enterocolitis due to Clostridium difficile, recurrent: Secondary | ICD-10-CM | POA: Diagnosis present

## 2015-01-18 DIAGNOSIS — I959 Hypotension, unspecified: Secondary | ICD-10-CM | POA: Diagnosis present

## 2015-01-18 DIAGNOSIS — A047 Enterocolitis due to Clostridium difficile: Principal | ICD-10-CM | POA: Diagnosis present

## 2015-01-18 DIAGNOSIS — Z833 Family history of diabetes mellitus: Secondary | ICD-10-CM

## 2015-01-18 DIAGNOSIS — A0472 Enterocolitis due to Clostridium difficile, not specified as recurrent: Secondary | ICD-10-CM

## 2015-01-18 DIAGNOSIS — K219 Gastro-esophageal reflux disease without esophagitis: Secondary | ICD-10-CM | POA: Diagnosis present

## 2015-01-18 DIAGNOSIS — Z720 Tobacco use: Secondary | ICD-10-CM

## 2015-01-18 DIAGNOSIS — F1010 Alcohol abuse, uncomplicated: Secondary | ICD-10-CM | POA: Diagnosis present

## 2015-01-18 HISTORY — DX: Gastro-esophageal reflux disease without esophagitis: K21.9

## 2015-01-18 LAB — C DIFFICILE QUICK SCREEN W PCR REFLEX
C DIFFICILE (CDIFF) INTERP: POSITIVE
C DIFFICLE (CDIFF) ANTIGEN: POSITIVE — AB
C Diff toxin: POSITIVE — AB

## 2015-01-18 LAB — COMPREHENSIVE METABOLIC PANEL
ALK PHOS: 68 U/L (ref 38–126)
ALT: 33 U/L (ref 17–63)
AST: 22 U/L (ref 15–41)
Albumin: 4.3 g/dL (ref 3.5–5.0)
Anion gap: 8 (ref 5–15)
BUN: 7 mg/dL (ref 6–20)
CHLORIDE: 105 mmol/L (ref 101–111)
CO2: 27 mmol/L (ref 22–32)
CREATININE: 0.87 mg/dL (ref 0.61–1.24)
Calcium: 9.2 mg/dL (ref 8.9–10.3)
GFR calc Af Amer: >60 mL/min (ref >60)
GFR calc non Af Amer: >60 mL/min (ref >60)
Glucose, Bld: 93 mg/dL (ref 65–99)
Potassium: 3.3 mmol/L — ABNORMAL LOW (ref 3.5–5.1)
SODIUM: 140 mmol/L (ref 135–145)
Total Bilirubin: 1.1 mg/dL (ref 0.3–1.2)
Total Protein: 7.2 g/dL (ref 6.5–8.1)

## 2015-01-18 LAB — CBC WITH DIFFERENTIAL/PLATELET
BASOS ABS: 0 10*3/uL (ref 0–0.1)
Basophils Relative: 1 %
EOS ABS: 0.2 10*3/uL (ref 0–0.7)
EOS PCT: 6 %
HCT: 42.6 % (ref 40.0–52.0)
HEMOGLOBIN: 15 g/dL (ref 13.0–18.0)
Lymphocytes Relative: 21 %
Lymphs Abs: 0.9 10*3/uL — ABNORMAL LOW (ref 1.0–3.6)
MCH: 32.4 pg (ref 26.0–34.0)
MCHC: 35.1 g/dL (ref 32.0–36.0)
MCV: 92.2 fL (ref 80.0–100.0)
Monocytes Absolute: 0.5 10*3/uL (ref 0.2–1.0)
Monocytes Relative: 12 %
Neutro Abs: 2.6 10*3/uL (ref 1.4–6.5)
Neutrophils Relative %: 60 %
PLATELETS: 202 10*3/uL (ref 150–440)
RBC: 4.62 MIL/uL (ref 4.40–5.90)
RDW: 12.4 % (ref 11.5–14.5)
WBC: 4.3 10*3/uL (ref 3.8–10.6)

## 2015-01-18 LAB — LIPASE, BLOOD: Lipase: 20 U/L — ABNORMAL LOW (ref 22–51)

## 2015-01-18 LAB — ETHANOL: Alcohol, Ethyl (B): <5 mg/dL

## 2015-01-18 MED ORDER — MORPHINE SULFATE (PF) 4 MG/ML IV SOLN
4.0000 mg | Freq: Once | INTRAVENOUS | Status: AC
  Administered 2015-01-18: 4 mg via INTRAVENOUS
  Filled 2015-01-18: qty 1

## 2015-01-18 MED ORDER — ACETAMINOPHEN 325 MG PO TABS
650.0000 mg | ORAL_TABLET | Freq: Four times a day (QID) | ORAL | Status: DC | PRN
  Administered 2015-01-19: 22:00:00 650 mg via ORAL
  Filled 2015-01-18: qty 2

## 2015-01-18 MED ORDER — ONDANSETRON HCL 4 MG/2ML IJ SOLN
4.0000 mg | Freq: Once | INTRAMUSCULAR | Status: AC
  Administered 2015-01-18: 4 mg via INTRAVENOUS
  Filled 2015-01-18: qty 2

## 2015-01-18 MED ORDER — ENOXAPARIN SODIUM 40 MG/0.4ML ~~LOC~~ SOLN
40.0000 mg | SUBCUTANEOUS | Status: DC
  Administered 2015-01-19 – 2015-01-26 (×9): 40 mg via SUBCUTANEOUS
  Filled 2015-01-18 (×10): qty 0.4

## 2015-01-18 MED ORDER — SODIUM CHLORIDE 0.9 % IV SOLN
INTRAVENOUS | Status: AC
  Administered 2015-01-19: via INTRAVENOUS

## 2015-01-18 MED ORDER — IOHEXOL 240 MG/ML SOLN
25.0000 mL | INTRAMUSCULAR | Status: AC
  Administered 2015-01-18: 25 mL via ORAL
  Administered 2015-01-18: 50 mL via ORAL

## 2015-01-18 MED ORDER — VANCOMYCIN 50 MG/ML ORAL SOLUTION
500.0000 mg | Freq: Four times a day (QID) | ORAL | Status: DC
  Administered 2015-01-19 – 2015-01-20 (×6): 500 mg via ORAL
  Filled 2015-01-18 (×9): qty 10

## 2015-01-18 MED ORDER — PANTOPRAZOLE SODIUM 40 MG PO TBEC
40.0000 mg | DELAYED_RELEASE_TABLET | Freq: Two times a day (BID) | ORAL | Status: DC
  Administered 2015-01-19 – 2015-01-20 (×4): 40 mg via ORAL
  Filled 2015-01-18 (×5): qty 1

## 2015-01-18 MED ORDER — VANCOMYCIN 50 MG/ML ORAL SOLUTION
125.0000 mg | Freq: Once | ORAL | Status: AC
  Administered 2015-01-18: 125 mg via ORAL
  Filled 2015-01-18: qty 2.5

## 2015-01-18 MED ORDER — INFLUENZA VAC SPLIT QUAD 0.5 ML IM SUSY
0.5000 mL | PREFILLED_SYRINGE | INTRAMUSCULAR | Status: AC
  Administered 2015-01-21: 0.5 mL via INTRAMUSCULAR
  Filled 2015-01-18: qty 0.5

## 2015-01-18 MED ORDER — SODIUM CHLORIDE 0.9 % IV BOLUS (SEPSIS)
1000.0000 mL | Freq: Once | INTRAVENOUS | Status: AC
  Administered 2015-01-18: 1000 mL via INTRAVENOUS

## 2015-01-18 MED ORDER — IOHEXOL 300 MG/ML  SOLN
100.0000 mL | Freq: Once | INTRAMUSCULAR | Status: AC | PRN
  Administered 2015-01-18: 100 mL via INTRAVENOUS

## 2015-01-18 MED ORDER — MORPHINE SULFATE (PF) 2 MG/ML IV SOLN
2.0000 mg | INTRAVENOUS | Status: DC | PRN
  Administered 2015-01-19 – 2015-01-21 (×3): 2 mg via INTRAVENOUS
  Filled 2015-01-18 (×4): qty 1

## 2015-01-18 MED ORDER — ONDANSETRON HCL 4 MG PO TABS
4.0000 mg | ORAL_TABLET | Freq: Four times a day (QID) | ORAL | Status: DC | PRN
Start: 2015-01-18 — End: 2015-01-27

## 2015-01-18 MED ORDER — ONDANSETRON HCL 4 MG/2ML IJ SOLN: 4.0000 mg | Freq: Four times a day (QID) | INTRAMUSCULAR | Status: DC | PRN

## 2015-01-18 MED ORDER — ACETAMINOPHEN 650 MG RE SUPP
650.0000 mg | Freq: Four times a day (QID) | RECTAL | Status: DC | PRN
Start: 2015-01-18 — End: 2015-01-27

## 2015-01-18 NOTE — H&P (Signed)
[facility] [place] [facility]   PATIENT NAME: Jeremy Spence    MR#:  [id number]  DATE OF BIRTH:  [birth year]  DATE OF ADMISSION:  01/18/2015  PRIMARY CARE PHYSICIAN: [facility]   REQUESTING/REFERRING PHYSICIAN: Pershing Proud, MD  CHIEF COMPLAINT:   Chief Complaint  Patient presents with  . Inguinal Hernia    HISTORY OF PRESENT ILLNESS:  [name]  is a 49 y.o. male who presents with recurrent C dif colitis.  Patient states that he has had persistent diarrhea and GI symptoms for the last 5 months. He was seen in our ED couple of weeks ago and was found have a colitis. C. difficile was not checked at that time, he was sent home on Cipro and Flagyl. He states that his symptoms improved somewhat, specifically his vomiting and his abdominal pain while on these antibiotics. However, once he finished the course his abdominal pain began again. He states that the diarrhea never really changed. Doesn't have insurance. He has an appointment through charity care with [facility] to see a gastroenterologist in December. Tonight in the ED C. difficile was checked and was positive. Hospitalists were called for admission for recurrent C. difficile causing colitis, which was seen on repeat CT abdomen and pelvis tonight.  PAST MEDICAL HISTORY:   Past Medical History  Diagnosis Date  . GERD (gastroesophageal reflux disease)     PAST SURGICAL HISTORY:   Past Surgical History  Procedure Laterality Date  . Stomach surgery      SOCIAL HISTORY:   Social History  Substance Use Topics  . Smoking status: Never Smoker   . Smokeless tobacco: Current User    Types: Snuff  . Alcohol Use: No    FAMILY HISTORY:   Family History  Problem Relation Age of Onset  . Heart disease Father   . Diabetes Father     DRUG ALLERGIES:  No Known Allergies  MEDICATIONS AT HOME:   Prior to Admission medications   Medication Sig Start Date End Date Taking?  Authorizing Provider  ondansetron (ZOFRAN) 4 MG tablet Take 1 tablet (4 mg total) by mouth daily as needed for nausea or vomiting. 01/05/15  Yes Jene Every, MD    REVIEW OF SYSTEMS:  Review of Systems  Constitutional: Negative for fever, chills, weight loss and malaise/fatigue.  HENT: Negative for ear pain, hearing loss and tinnitus.   Eyes: Negative for blurred vision, double vision, pain and redness.  Respiratory: Negative for cough, hemoptysis and shortness of breath.   Cardiovascular: Negative for chest pain, palpitations, orthopnea and leg swelling.  Gastrointestinal: Positive for nausea, abdominal pain and diarrhea. Negative for vomiting and constipation.  Genitourinary: Negative for dysuria, frequency and hematuria.  Musculoskeletal: Negative for back pain, joint pain and neck pain.  Skin:       No acne, rash, or lesions  Neurological: Negative for dizziness, tremors, focal weakness and weakness.  Endo/Heme/Allergies: Negative for polydipsia. Does not bruise/bleed easily.  Psychiatric/Behavioral: Negative for depression. The patient is not nervous/anxious and does not have insomnia.      VITAL SIGNS:   Filed Vitals:   01/18/15 2100 01/18/15 2130 01/18/15 2200 01/18/15 2230  BP: 112/73 104/67 122/69 109/70  Pulse: 58 58 58 50  Temp:      TempSrc:      Resp: 16   16  Height:      Weight:      SpO2: 98% 100% 98% 97%   Wt Readings from Last 3  Encounters:  01/18/15 86.183 kg (190 lb)  01/05/15 86.183 kg (190 lb)  09/02/14 92.987 kg (205 lb)    PHYSICAL EXAMINATION:  Physical Exam  Vitals reviewed. Constitutional: He is oriented to person, place, and time. He appears well-developed and well-nourished. No distress.  HENT:  Head: Normocephalic and atraumatic.  Mouth/Throat: Oropharynx is clear and moist.  Eyes: Conjunctivae and EOM are normal. Pupils are equal, round, and reactive to light. No scleral icterus.  Neck: Normal range of motion. Neck supple. No JVD  present. No thyromegaly present.  Cardiovascular: Normal rate, regular rhythm and intact distal pulses.  Exam reveals no gallop and no friction rub.   No murmur heard. Respiratory: Effort normal and breath sounds normal. No respiratory distress. He has no wheezes. He has no rales.  GI: Soft. Bowel sounds are normal. He exhibits no distension. There is tenderness.  Musculoskeletal: Normal range of motion. He exhibits no edema.  No arthritis, no gout  Lymphadenopathy:    He has no cervical adenopathy.  Neurological: He is alert and oriented to person, place, and time. No cranial nerve deficit.  No dysarthria, no aphasia  Skin: Skin is warm and dry. No rash noted. No erythema.  Psychiatric: He has a normal mood and affect. His behavior is normal. Judgment and thought content normal.    LABORATORY PANEL:   CBC  Recent Labs Lab 01/18/15 1842  WBC 4.3  HGB 15.0  HCT 42.6  PLT 202   ------------------------------------------------------------------------------------------------------------------  Chemistries   Recent Labs Lab 01/18/15 1842  NA 140  K 3.3*  CL 105  CO2 27  GLUCOSE 93  BUN 7  CREATININE 0.87  CALCIUM 9.2  AST 22  ALT 33  ALKPHOS 68  BILITOT 1.1   ------------------------------------------------------------------------------------------------------------------  Cardiac Enzymes No results for input(s): TROPONINI in the last 168 hours. ------------------------------------------------------------------------------------------------------------------  RADIOLOGY:  Ct Abdomen Pelvis W Contrast  01/18/2015  CLINICAL DATA:  LEFT lower quadrant pain. Recent diagnosis of enteritis. EXAM: CT ABDOMEN AND PELVIS WITH CONTRAST TECHNIQUE: Multidetector CT imaging of the abdomen and pelvis was performed using the standard protocol following bolus administration of intravenous contrast. CONTRAST:  100mL OMNIPAQUE IOHEXOL 300 MG/ML  SOLN COMPARISON:  CT 01/05/2015  FINDINGS: Lower chest: Lung bases are clear. Hepatobiliary: No focal hepatic lesion. No biliary duct dilatation. Gallbladder is normal. Common bile duct is normal. Pancreas: Pancreas is normal. No ductal dilatation. No pancreatic inflammation. Spleen: Normal spleen Adrenals/urinary tract: Adrenal glands and kidneys are normal. The ureters and bladder normal. Stomach/Bowel: The stomach and duodenum are normal. There is interval resolution of the diffuse small bowel dilatation and bowel inflammation seen on comparison exam. There is normal mucosal pattern of the jejunum and ileum. Contrast flows into the ascending colon. Appendix not identified. Beginning in the descending colon there is a long segment of mild bowel wall thickening and mild pericolonic inflammation. This extends into the proximal sigmoid colon (images 45 through 73 of series 2). This LEFT colon inflammation is also seen on coronal image 73 and 55. There is mild bowel wall thickening in the rectum. Vascular/Lymphatic: Abdominal aorta is normal caliber. There is no retroperitoneal or periportal lymphadenopathy. No pelvic lymphadenopathy. Reproductive: Normal prostate Musculoskeletal: SI joints appear normal. No aggressive osseous lesion. Other: Bilateral fat filled inguinal hernias. IMPRESSION: Long segment of descending colon and proximal sigmoid colon inflammation consistent with segmental colitis. Differential would include inflammatory bowel disease, infectious colitis, drug induced colitis, and less likely ischemic colitis. Resolution of enteritis seen on comparison exam.  Electronically Signed   By: Genevive Bi M.D.   On: 01/18/2015 20:15    EKG:   Orders placed or performed during the hospital encounter of 01/05/15  . ED EKG  . ED EKG  . EKG 12-Lead  . EKG 12-Lead  . EKG    IMPRESSION AND PLAN:  Principal Problem:   Recurrent colitis due to Clostridium difficile - oral vancomycin started in the ED. We'll convert to higher  dosing as this is recurrent infection. GI consult. Active Problems:   GERD (gastroesophageal reflux disease) - PPI while here  All the records are reviewed and case discussed with ED provider. Management plans discussed with the patient and/or family.  DVT PROPHYLAXIS: SubQ lovenox  ADMISSION STATUS: Observation  CODE STATUS: Full  TOTAL TIME TAKING CARE OF THIS PATIENT: 40 minutes.    Kaprice Kage FIELDING 01/18/2015, 11:07 PM  [facility]  Office  [phone]  CC: Primary care physician; [facility]

## 2015-01-18 NOTE — ED Notes (Signed)
C/o left pelvic pain where patient has known hernia.  States pain began this morning and he was unable to reduce hernia.  Patient now able to reduce hernia, but pain has not lessened.  C/O pain across groin area.

## 2015-01-18 NOTE — ED Notes (Signed)
Patient is resting comfortably. 

## 2015-01-18 NOTE — ED Provider Notes (Signed)
[facility] Emergency Department Provider Note  ____________________________________________  Time seen: Approximately 6 PM  I have reviewed the triage vital signs and the nursing notes.   HISTORY  Chief Complaint Inguinal Hernia    HPI Jeremy Spence is a 49 y.o. male with a recent diagnosis of colitis who is presenting today with left lower quadrant abdominal pain. He said that he felt his hernia protruding out this morning. He says he was able to push it back in but that he now is persistent pain. He says that he has had persistent pain since being diagnosed with colitis at his last emergency Department visit several weeks ago. He completed a course of Cipro and Flagyl. He is still having diarrhea which she describes as nonbloody and watery. He says he is having 8-10 episodes per day. Denies any further vomiting. Denies any upper abdominal pain.Says that he is able to eat and is not in any nausea or vomiting for the past week.   History reviewed. No pertinent past medical history.  There are no active problems to display for this patient.   Past Surgical History  Procedure Laterality Date  . Stomach surgery      Current Outpatient Rx  Name  Route  Sig  Dispense  Refill  . ondansetron (ZOFRAN) 4 MG tablet   Oral   Take 1 tablet (4 mg total) by mouth daily as needed for nausea or vomiting.   20 tablet   1   . ciprofloxacin (CIPRO) 500 MG tablet   Oral   Take 1 tablet (500 mg total) by mouth 2 (two) times daily. Patient not taking: Reported on 01/18/2015   14 tablet   0   . metoCLOPramide (REGLAN) 10 MG tablet   Oral   Take 1 tablet (10 mg total) by mouth every 8 (eight) hours as needed for nausea or vomiting. Patient not taking: Reported on 01/05/2015   12 tablet   1   . metroNIDAZOLE (FLAGYL) 500 MG tablet   Oral   Take 1 tablet (500 mg total) by mouth 2 (two) times daily after a meal. Patient not taking: Reported on 01/18/2015    14 tablet   0   . omeprazole (PRILOSEC) 40 MG capsule   Oral   Take 1 capsule (40 mg total) by mouth daily. Patient not taking: Reported on 01/18/2015   30 capsule   1     Allergies Review of patient's allergies indicates no known allergies.  No family history on file.  Social History Social History  Substance Use Topics  . Smoking status: Never Smoker   . Smokeless tobacco: Current User    Types: Snuff  . Alcohol Use: No    Review of Systems Constitutional: No fever/chills Eyes: No visual changes. ENT: No sore throat. Cardiovascular: Denies chest pain. Respiratory: Denies shortness of breath. Gastrointestinal:  No nausea, no vomiting.   No constipation. Genitourinary: Negative for dysuria. Musculoskeletal: Negative for back pain. Skin: Negative for rash. Neurological: Negative for headaches, focal weakness or numbness.  10-point ROS otherwise negative.  ____________________________________________   PHYSICAL EXAM:  VITAL SIGNS: ED Triage Vitals  Enc Vitals Group     BP 01/18/15 1618 128/92 mmHg     Pulse Rate 01/18/15 1618 69     Resp 01/18/15 1618 16     Temp 01/18/15 1618 97.9 F (36.6 C)     Temp Source 01/18/15 1618 Oral     SpO2 01/18/15 1618 95 %  Weight 01/18/15 1618 190 lb (86.183 kg)     Height 01/18/15 1618 5\' 7"  (1.702 m)     Head Cir --      Peak Flow --      Pain Score 01/18/15 1619 8     Pain Loc --      Pain Edu? --      Excl. in GC? --     Constitutional: Alert and oriented. Well appearing and in no acute distress. Eyes: Conjunctivae are normal. PERRL. EOMI. Head: Atraumatic. Nose: No congestion/rhinnorhea. Mouth/Throat: Mucous membranes are moist.  Oropharynx non-erythematous. Neck: No stridor.   Cardiovascular: Normal rate, regular rhythm. Grossly normal heart sounds.  Good peripheral circulation. Respiratory: Normal respiratory effort.  No retractions. Lungs CTAB. Gastrointestinal: Soft with suprapubic as well as left  lower quadrant tenderness to palpation. There is no rebound or guarding.. No distention. No abdominal bruits. No CVA tenderness. Genitourinary:  Tenderness to the left groin without any palpable hernia sac. Normal scrotal exam. Musculoskeletal: No lower extremity tenderness nor edema.  No joint effusions. Neurologic:  Normal speech and language. No gross focal neurologic deficits are appreciated. No gait instability. Skin:  Skin is warm, dry and intact. No rash noted. Psychiatric: Mood and affect are normal. Speech and behavior are normal.  ____________________________________________   LABS (all labs ordered are listed, but only abnormal results are displayed)  Labs Reviewed  C DIFFICILE QUICK SCREEN W PCR REFLEX - Abnormal; Notable for the following:    C Diff antigen POSITIVE (*)    C Diff toxin POSITIVE (*)    All other components within normal limits  CBC WITH DIFFERENTIAL/PLATELET - Abnormal; Notable for the following:    Lymphs Abs 0.9 (*)    All other components within normal limits  COMPREHENSIVE METABOLIC PANEL - Abnormal; Notable for the following:    Potassium 3.3 (*)    All other components within normal limits  LIPASE, BLOOD - Abnormal; Notable for the following:    Lipase 20 (*)    All other components within normal limits  GIARDIA, EIA; OVA/PARASITE  STOOL CULTURE  ETHANOL  GI PATHOGEN PANEL BY PCR, STOOL   ____________________________________________  EKG   ____________________________________________  RADIOLOGY  Colitis with a long segment of the descending colon. Resolution of enteritis since last scan. ____________________________________________   PROCEDURES    ____________________________________________   INITIAL IMPRESSION / ASSESSMENT AND PLAN / ED COURSE  Pertinent labs & imaging results that were available during my care of the patient were reviewed by me and considered in my medical decision making (see chart for  details).  ----------------------------------------- 9:42 PM on 01/18/2015 -----------------------------------------  Discussed with patient as well as Relafen that he tested positive for C. difficile colitis. The patient has no insurance and will not be able to get the antibiotic as an outpatient. He is also refractory to his first round of antibiotics with Flagyl. We'll admit for recurrent C. difficile. First dose of vancomycin given here. We'll also give pain meds secondary to patient having abdominal cramping. ____________________________________________   FINAL CLINICAL IMPRESSION(S) / ED DIAGNOSES  Recurrent C. difficile. Lower abdominal cramping. Return visit.    Myrna Blazeravid Matthew Karima Carrell, MD 01/18/15 [phone]

## 2015-01-18 NOTE — ED Notes (Signed)
Jeremy CooterHenry RN called radiology to come and pick up the Pt for CT.

## 2015-01-18 NOTE — ED Notes (Signed)
Sherilyn CooterHenry RN tried to call report but receiving RN was with another Pt. Receiving RN to call for report.

## 2015-01-19 LAB — PHOSPHORUS: PHOSPHORUS: 4 mg/dL (ref 2.5–4.6)

## 2015-01-19 LAB — BASIC METABOLIC PANEL
Anion gap: 7 (ref 5–15)
BUN: 8 mg/dL (ref 6–20)
CO2: 30 mmol/L (ref 22–32)
CREATININE: 0.95 mg/dL (ref 0.61–1.24)
Calcium: 8.8 mg/dL — ABNORMAL LOW (ref 8.9–10.3)
Chloride: 106 mmol/L (ref 101–111)
GFR calc Af Amer: >60 mL/min (ref >60)
Glucose, Bld: 109 mg/dL — ABNORMAL HIGH (ref 65–99)
Potassium: 3.8 mmol/L (ref 3.5–5.1)
SODIUM: 143 mmol/L (ref 135–145)

## 2015-01-19 LAB — CBC
HCT: 43 % (ref 40.0–52.0)
Hemoglobin: 14.8 g/dL (ref 13.0–18.0)
MCH: 32.1 pg (ref 26.0–34.0)
MCHC: 34.5 g/dL (ref 32.0–36.0)
MCV: 93.1 fL (ref 80.0–100.0)
PLATELETS: 161 10*3/uL (ref 150–440)
RBC: 4.62 MIL/uL (ref 4.40–5.90)
RDW: 12.6 % (ref 11.5–14.5)
WBC: 6.9 10*3/uL (ref 3.8–10.6)

## 2015-01-19 LAB — MAGNESIUM: MAGNESIUM: 2 mg/dL (ref 1.7–2.4)

## 2015-01-19 MED ORDER — OXYCODONE-ACETAMINOPHEN 5-325 MG PO TABS
1.0000 | ORAL_TABLET | ORAL | Status: DC | PRN
  Administered 2015-01-20 – 2015-01-26 (×28): 1 via ORAL
  Filled 2015-01-19 (×28): qty 1

## 2015-01-19 MED ORDER — SODIUM CHLORIDE 0.9 % IV SOLN
INTRAVENOUS | Status: DC
  Administered 2015-01-19 – 2015-01-20 (×2): via INTRAVENOUS

## 2015-01-19 NOTE — Progress Notes (Signed)
Initial Nutrition Assessment   INTERVENTION:   Meals and Snacks: Cater to patient preferences Medical Food Supplement Therapy: will recommend on follow if intake poor   NUTRITION DIAGNOSIS:   No nutrition diagnosis at this time  GOAL:   Patient will meet greater than or equal to 90% of their needs  MONITOR:    (Energy Intake, Digestive system, Anthropometrics)  REASON FOR ASSESSMENT:   Diagnosis    ASSESSMENT:   Pt admitted with colitis secondary to recurrent c.diff. Per MD note pt with diarrhea for the past 5 months PTA. Pt currently on isolation for c.diff.  Past Medical History  Diagnosis Date  . GERD (gastroesophageal reflux disease)     Diet Order:  Diet regular Room service appropriate?: Yes; Fluid consistency:: Thin   Current Nutrition: Pt ate everything except pancakes this am on breakfast tray. Per family member pt does not like pancakes. Pt was very sleepy on visit, difficult to keep awake.   Food/Nutrition-Related History: Per pt family member pt has a good appetite PTA eating 3 meals per day plus snacks and also reports pt stopped drinking this past May 2016.   Scheduled Medications:  . enoxaparin (LOVENOX) injection  40 mg Subcutaneous Q24H  . Influenza vac split quadrivalent PF  0.5 mL Intramuscular Tomorrow-1000  . pantoprazole  40 mg Oral BID  . vancomycin  500 mg Oral 4 times per day     Electrolyte/Renal Profile and Glucose Profile:   Recent Labs Lab 01/18/15 1842 01/19/15 0353  NA 140 143  K 3.3* 3.8  CL 105 106  CO2 27 30  BUN 7 8  CREATININE 0.87 0.95  CALCIUM 9.2 8.8*  MG 2.0  --   PHOS 4.0  --   GLUCOSE 93 109*   Protein Profile:  Recent Labs Lab 01/18/15 1842  ALBUMIN 4.3    Gastrointestinal Profile: Last BM:  01/19/2015   Weight Change: Pt family member reports weight of 180lbs UBW, current weight 191lbs. Per [facility] recorded weight of 205lbs 4 months ago.    Skin:  Reviewed, no issues  Height:   Ht Readings  from Last 1 Encounters:  01/18/15 5\' 7"  (1.702 m)    Weight:   Wt Readings from Last 1 Encounters:  01/18/15 191 lb 3.2 oz (86.728 kg)    Wt Readings from Last 10 Encounters:  01/18/15 191 lb 3.2 oz (86.728 kg)  01/05/15 190 lb (86.183 kg)  09/02/14 205 lb (92.987 kg)  08/11/14 205 lb (92.987 kg)     BMI:  Body mass index is 29.94 kg/(m^2).   EDUCATION NEEDS:   No education needs identified at this time   LOW Care Level  Leda QuailAllyson Shean Gerding, [place]RD, LDN Pager [phone]

## 2015-01-19 NOTE — Consult Note (Signed)
GI Inpatient Consult Note  Reason for Consult: Recurrent C-Diff   Attending Requesting Consult: Dr. Renae GlossWieting  History of Present Illness: Jeremy Spence is a 49 y.o. male with a pmh of alcohol abuse, quit in May 2016.  He reports that he has had soft bowel movements for at least the past year.  He started having episodes of diarrhea in May.  They became worse at the beginning of October.  He reports he was having 6-8 episodes a day with occasional nocturnal episodes. Green/brown in color, denies any blood.  He reports he went to the emergency department at [facility] on January 05, 2015, was given Flagyl and Cipro after a CT showed possible colitis.  He did finish the scripts. He reports the diarrhea never slowed down. He was not tested for C-diff during this visit.  He denies any prior abx use.  Used Imodium once time at the beg of Oct without relief.   He is continuing to have 6-8 episodes a day along with nocturnal episodes.  He reports having bilateral lower abdominal pain,which began in the beg of Oct, describes as sharp, intermittent, relief with bowel movement occasionally.  He also reports a history of nausea and vomiting since May 2016.   Reports that he was having dry heaves on and off until 2 weeks ago. Denies hematemesis. [facility]. Reports that he was told he had a tear in his esophagus.  No endoscopy was completed.  Was given Zofran  with relief.  [facility] was evaluated further for the diarrhea, was told his "liver was hard and was given at hepatitis-C shot."  They also started him on Prilosec daily.  He reports he was having heartburn and acid reflux symptoms on an almost daily basis, was not taking anything for this.  He reports on Prilosec his symptoms seemed to have resolved.  He does report night sweats almost every night, reports proper bed hygiene.  Endorses rare use of Aleve.  Reports a  surgical a history of having had 1 lining of his stomach removed at the age of 49 after drinking excessive hard alcohol.  He has never had an endoscopy, has a family history of colon polyps in his mother.  [facility]  On December 2 for further evaluation of the vomiting issue.  He has no health insurance, he had applied through charity care for this appointment.  Past Medical History:  Past Medical History  Diagnosis Date  . GERD (gastroesophageal reflux disease)     Problem List: Patient Active Problem List   Diagnosis Date Noted  . Recurrent colitis due to Clostridium difficile 01/18/2015  . GERD (gastroesophageal reflux disease) 01/18/2015    Past Surgical History: Past Surgical History  Procedure Laterality Date  . Stomach surgery      Allergies: No Known Allergies  Home Medications: Prescriptions prior to admission  Medication Sig Dispense Refill Last Dose  . ondansetron (ZOFRAN) 4 MG tablet Take 1 tablet (4 mg total) by mouth daily as needed for nausea or vomiting. 20 tablet 1 Past Month at Unknown time   Home medication reconciliation was completed with the patient.   Scheduled Inpatient Medications:   . enoxaparin (LOVENOX) injection  40 mg Subcutaneous Q24H  . Influenza vac split quadrivalent PF  0.5 mL Intramuscular Tomorrow-1000  . pantoprazole  40 mg Oral BID  . vancomycin  500 mg Oral 4 times per day    Continuous Inpatient Infusions:   . sodium chloride 75 mL/hr at 01/19/15 0014    PRN Inpatient Medications:  acetaminophen **OR** acetaminophen, morphine injection, ondansetron **OR** ondansetron (ZOFRAN) IV  Family History: family history includes Diabetes in his father; Heart disease in his father.   Social History:   reports that he has never smoked. His smokeless tobacco use includes Snuff. He reports that he does not drink alcohol or use illicit drugs.   Review of Systems: Constitutional: Weight is stable.  Eyes: No changes  in vision. ENT: No oral lesions, sore throat.  GI: see HPI.  Heme/Lymph: No easy bruising.  CV: No chest pain.  GU: No hematuria.  Integumentary: No rashes.  Neuro: No headaches.  Psych: No depression/anxiety.  Endocrine: No heat/cold intolerance.  Allergic/Immunologic: No urticaria.  Resp: No cough, SOB.  Musculoskeletal: No joint swelling.    Physical Examination: BP 105/57 mmHg  Pulse 86  Temp(Src) 99.4 F (37.4 C) (Oral)  Resp 18  Ht  (1.702 m)  Wt 86.728 kg (191 lb 3.2 oz)  BMI 29.94 kg/m2  SpO2 95% Gen: NAD, alert and oriented x 4 HEENT: PEERLA, EOMI, Neck: supple, no JVD or thyromegaly Chest: CTA bilaterally, no wheezes, crackles, or other adventitious sounds CV: RRR, no m/g/c/r Abd: soft, diffuse tenderness, ND, +BS in all four quadrants; no HSM, guarding, ridigity, or rebound tenderness Ext: no edema, well perfused with 2+ pulses, Skin: no rash or lesions noted Lymph: no LAD  Data: Lab Results  Component Value Date   WBC 6.9 01/19/2015   HGB 14.8 01/19/2015   HCT 43.0 01/19/2015   MCV 93.1 01/19/2015   PLT 161 01/19/2015    Recent Labs Lab 01/18/15 1842 01/19/15 0353  HGB 15.0 14.8   Lab Results  Component Value Date   NA 143 01/19/2015   K 3.8 01/19/2015   CL 106 01/19/2015   CO2 30 01/19/2015   BUN 8 01/19/2015   CREATININE 0.95 01/19/2015   Lab Results  Component Value Date   ALT 33 01/18/2015   AST 22 01/18/2015   ALKPHOS 68 01/18/2015   BILITOT 1.1 01/18/2015   No results for input(s): APTT, INR, PTT in the last 168 hours.   Imaging: CLINICAL DATA: LEFT lower quadrant pain. Recent diagnosis of enteritis.  EXAM: CT ABDOMEN AND PELVIS WITH CONTRAST  TECHNIQUE: Multidetector CT imaging of the abdomen and pelvis was performed using the standard protocol following bolus administration of intravenous contrast.  CONTRAST: OMNIPAQUE IOHEXOL 300 MG/ML SOLN  COMPARISON: CT 01/05/2015  FINDINGS: Lower chest:  Lung bases are clear.  Hepatobiliary: No focal hepatic lesion. No biliary duct dilatation. Gallbladder is normal. Common bile duct is normal.  Pancreas: Pancreas is normal. No ductal dilatation. No pancreatic inflammation.  Spleen: Normal spleen  Adrenals/urinary tract: Adrenal glands and kidneys are normal. The ureters and bladder normal.  Stomach/Bowel: The stomach and duodenum are normal. There is interval resolution of the diffuse small bowel dilatation and bowel inflammation seen on comparison exam. There is normal mucosal pattern of the jejunum and ileum. Contrast flows into the ascending colon. Appendix not identified.  Beginning in the descending colon there is a long segment of mild bowel wall thickening and mild pericolonic inflammation. This extends into the proximal sigmoid colon (images 45 through 73 of series 2). This LEFT colon inflammation is also seen on coronal image 73 and 55. There is mild bowel wall thickening in the rectum.  Vascular/Lymphatic: Abdominal aorta is normal caliber. There is no retroperitoneal or periportal lymphadenopathy. No pelvic lymphadenopathy.  Reproductive: Normal prostate  Musculoskeletal: SI joints appear normal. No aggressive osseous lesion.  Other: Bilateral fat filled inguinal hernias.  IMPRESSION: Long segment of descending colon and proximal sigmoid colon inflammation consistent with segmental colitis. Differential would include inflammatory bowel disease, infectious colitis, drug induced colitis, and less likely ischemic colitis.  Resolution of enteritis seen on comparison exam.   Electronically Signed  By: Genevive Bi M.D.  On: 01/18/2015 20:15  Assessment/Plan: Jeremy Spence is a 49 y.o. male who is a recovering alcoholic begin treated for c diff with Vanc, had been treated at the beginning of October with Cipro and Flagyl, although had not been tested for C.diff.  WBC WNL, afebrile. Stool  cultures pending  Recommendations: We recommend that patient be treated with oral vanc 250 mg four times a day, instead of 500 mg.  We do not consider this a recurrent episode, since he was never diagnosed with c diff prior.    We will continue to follow with you. Thank you for the consult. Please call with questions or concerns.   Carney Harder, PA-C  I personally performed these services.

## 2015-01-19 NOTE — Progress Notes (Signed)
Patient ID: Jeremy Spence, male   DOB: Jul 05, 1965, 49 y.o.   MRN: [id number] [facility] PROGRESS NOTE  [facility]  HPI/Subjective: Patient seen earlier. He had 8 bowel movements overnight in the morning. Watery brown stool. Patient having abdominal pain. Some nausea but no vomiting currently.  Objective: Filed Vitals:   01/19/15 0846  BP: 105/57  Pulse: 86  Temp:   Resp:    No intake or output data in the 24 hours ending 01/19/15 1428 Filed Weights   01/18/15 1618 01/18/15 2345  Weight: 86.183 kg (190 lb) 86.728 kg (191 lb 3.2 oz)    ROS: Review of Systems  Constitutional: Negative for fever and chills.  Eyes: Negative for blurred vision.  Respiratory: Negative for cough and shortness of breath.   Cardiovascular: Negative for chest pain.  Gastrointestinal: Positive for nausea, abdominal pain and diarrhea. Negative for vomiting, constipation and blood in stool.  Genitourinary: Negative for dysuria.  Musculoskeletal: Negative for joint pain.  Neurological: Negative for dizziness and headaches.   Exam: Physical Exam  Constitutional: He is oriented to person, place, and time.  HENT:  Nose: No mucosal edema.  Mouth/Throat: No oropharyngeal exudate or posterior oropharyngeal edema.  Eyes: Conjunctivae, EOM and lids are normal. Pupils are equal, round, and reactive to light.  Neck: No JVD present. Carotid bruit is not present. No edema present. No thyroid mass and no thyromegaly present.  Cardiovascular: S1 normal and S2 normal.  Exam reveals no gallop.   No murmur heard. Pulses:      Dorsalis pedis pulses are 2+ on the right side, and 2+ on the left side.  Respiratory: No respiratory distress. He has no wheezes. He has no rhonchi. He has no rales.  GI: Soft. Bowel sounds are normal. There is no tenderness.  Musculoskeletal:       Right ankle: He exhibits no swelling.       Left ankle: He exhibits no swelling.  Lymphadenopathy:    He  has no cervical adenopathy.  Neurological: He is alert and oriented to person, place, and time. No cranial nerve deficit.  Skin: Skin is warm. No rash noted. Nails show no clubbing.  Psychiatric: He has a normal mood and affect.    Data Reviewed: Basic Metabolic Panel:  Recent Labs Lab 01/18/15 1842 01/19/15 0353  NA 140 143  K 3.3* 3.8  CL 105 106  CO2 27 30  GLUCOSE 93 109*  BUN 7 8  CREATININE 0.87 0.95  CALCIUM 9.2 8.8*  MG 2.0  --   PHOS 4.0  --    Liver Function Tests:  Recent Labs Lab 01/18/15 1842  AST 22  ALT 33  ALKPHOS 68  BILITOT 1.1  PROT 7.2  ALBUMIN 4.3    Recent Labs Lab 01/18/15 1842  LIPASE 20*   CBC:  Recent Labs Lab 01/18/15 1842 01/19/15 0353  WBC 4.3 6.9  NEUTROABS 2.6  --   HGB 15.0 14.8  HCT 42.6 43.0  MCV 92.2 93.1  PLT 202 161     Recent Results (from the past 240 hour(s))  C difficile quick scan w PCR reflex     Status: Abnormal   Collection Time: 01/18/15  6:42 PM  Result Value Ref Range Status   C Diff antigen POSITIVE (A) NEGATIVE Final   C Diff toxin POSITIVE (A) NEGATIVE Final   C Diff interpretation   Final    Positive for toxigenic C. difficile, active toxin production present.  Comment: CRITICAL RESULT CALLED TO, READ BACK BY AND VERIFIED WITHPaulene Floor:  HENRY RIVERA AT 2046 01/18/15 SDR      Studies: Ct Abdomen Pelvis W Contrast  01/18/2015  CLINICAL DATA:  LEFT lower quadrant pain. Recent diagnosis of enteritis. EXAM: CT ABDOMEN AND PELVIS WITH CONTRAST TECHNIQUE: Multidetector CT imaging of the abdomen and pelvis was performed using the standard protocol following bolus administration of intravenous contrast. CONTRAST:  100mL OMNIPAQUE IOHEXOL 300 MG/ML  SOLN COMPARISON:  CT 01/05/2015 FINDINGS: Lower chest: Lung bases are clear. Hepatobiliary: No focal hepatic lesion. No biliary duct dilatation. Gallbladder is normal. Common bile duct is normal. Pancreas: Pancreas is normal. No ductal dilatation. No pancreatic  inflammation. Spleen: Normal spleen Adrenals/urinary tract: Adrenal glands and kidneys are normal. The ureters and bladder normal. Stomach/Bowel: The stomach and duodenum are normal. There is interval resolution of the diffuse small bowel dilatation and bowel inflammation seen on comparison exam. There is normal mucosal pattern of the jejunum and ileum. Contrast flows into the ascending colon. Appendix not identified. Beginning in the descending colon there is a long segment of mild bowel wall thickening and mild pericolonic inflammation. This extends into the proximal sigmoid colon (images 45 through 73 of series 2). This LEFT colon inflammation is also seen on coronal image 73 and 55. There is mild bowel wall thickening in the rectum. Vascular/Lymphatic: Abdominal aorta is normal caliber. There is no retroperitoneal or periportal lymphadenopathy. No pelvic lymphadenopathy. Reproductive: Normal prostate Musculoskeletal: SI joints appear normal. No aggressive osseous lesion. Other: Bilateral fat filled inguinal hernias. IMPRESSION: Long segment of descending colon and proximal sigmoid colon inflammation consistent with segmental colitis. Differential would include inflammatory bowel disease, infectious colitis, drug induced colitis, and less likely ischemic colitis. Resolution of enteritis seen on comparison exam. Electronically Signed   By: Genevive BiStewart  Edmunds M.D.   On: 01/18/2015 20:15    Scheduled Meds: . enoxaparin (LOVENOX) injection  40 mg Subcutaneous Q24H  . Influenza vac split quadrivalent PF  0.5 mL Intramuscular Tomorrow-1000  . pantoprazole  40 mg Oral BID  . vancomycin  500 mg Oral 4 times per day   Continuous Infusions: . sodium chloride 75 mL/hr at 01/19/15 1357    Assessment/Plan:  1. Clostridium difficile colitis. Patient having too much diarrhea and abdominal pain which is generalized in order to go home at this point. Oral vancomycin. Would like to see diarrhea and abdominal pain  settled down first. Oral and IV when necessary pain medication. IV fluid hydration. Recently treated with Cipro and Flagyl. 2. Gastroesophageal reflux disease without esophagitis- on PPI as outpatient. 3. History of previous alcohol abuse quit in May.  Code Status:     Code Status Orders        Start     Ordered   01/18/15 2341  Full code   Continuous     01/18/15 2340     Family Communication: Wife at bedside Disposition Plan: To be determined  Consultants:  Gastroenterology  Antibiotics:  Oral vancomycin  Time spent: 35 minutes earlier  Alford HighlandWIETING, Eustacia Urbanek  [facility]

## 2015-01-19 NOTE — Plan of Care (Signed)
Problem: Discharge Progression Outcomes Goal: Discharge plan in place and appropriate Alert and oriented pt.  Lives at home with significant other.  HX: GERD. Only takes zofran at home.  Low fall risk.  Able to verbalize any needs. Outcome: Progressing Individualization of Care Pt prefers to be called Jake SharkHarold Hx of GERD, recurrent C. Dif, colitis    Goal: Other Discharge Outcomes/Goals Treatment plan:  Stool sample sent to lab for cultures this shift.  Patient C. Dif in ED and on isolation precautions.  Plan to increase antibiotic regiment. Pain:  Patient without complaints of pain this shift on floor. Hemodynamically:  Patient afebrile and VSS.  BP 101/57 mmHg  Pulse 59  Temp(Src) 97.6 F (36.4 C) (Oral)  Resp 16  Ht 5\' 7"  (1.702 m)  Wt 191 lb 3.2 oz (86.728 kg)  BMI 29.94 kg/m2  SpO2 97% Complications:  Patient with continuing diarrhea this shift.   Diet:  Regular.  Patient able to swallow pills whole with water. Activity:  Patient up independently in room.  Steady on feet and oriented x 4

## 2015-01-19 NOTE — Plan of Care (Signed)
Problem: Discharge Progression Outcomes Goal: Other Discharge Outcomes/Goals Outcome: Progressing Low grade fever. VSS. Continue IVF. 6-7 green loose stool during the shift per pt. Continue ABX. Fair appetite. No c/o n/v. Morphine givenx2 for abd pain with good effect.

## 2015-01-19 NOTE — Consult Note (Signed)
Patient seen and examined chart reviewed. Please see full GI consult by Ms. Richards. She is admitted with a stream of several months of diarrhea. He was found to have colitis on CT scanning and C. difficile testing was positive. Other stool studies are still pending. Continue by mouth vancomycin at 250 mg 4 times a day. Patient has had a low-grade fever. His no rebound although he has a diffuse tenderness is marked. Bowel sounds are positive and normoactive. We'll follow with you. It is of note patient has not had testing for Clostridium difficile previously and his most recent antibiotic exposure was earlier this month when he was given a prescription of Flagyl and Cipro finding of colitis at that time. Etiology is uncertain at that time as no culture was done. It is possible that it was C. difficile liver could've been a different organism or etiology complicated by the antibiotic use. We'll follow with you.

## 2015-01-20 DIAGNOSIS — A0472 Enterocolitis due to Clostridium difficile, not specified as recurrent: Secondary | ICD-10-CM | POA: Diagnosis present

## 2015-01-20 MED ORDER — VANCOMYCIN 50 MG/ML ORAL SOLUTION
250.0000 mg | Freq: Four times a day (QID) | ORAL | Status: DC
  Administered 2015-01-20 – 2015-01-27 (×26): 250 mg via ORAL
  Filled 2015-01-20 (×30): qty 5

## 2015-01-20 MED ORDER — FAMOTIDINE 20 MG PO TABS
20.0000 mg | ORAL_TABLET | Freq: Two times a day (BID) | ORAL | Status: DC
  Administered 2015-01-20 – 2015-01-27 (×14): 20 mg via ORAL
  Filled 2015-01-20 (×14): qty 1

## 2015-01-20 MED ORDER — SODIUM CHLORIDE 0.9 % IV SOLN
INTRAVENOUS | Status: DC
  Administered 2015-01-20 – 2015-01-26 (×17): via INTRAVENOUS

## 2015-01-20 NOTE — Consult Note (Signed)
Subjective: Patient seen for c diff colitis. Patient less nauseated, continues with abdominalpain, states 7/10 (yesterday 8/10), multiple loose stool.   Objective: Vital signs in last 24 hours: Temp:  [97.3 F (36.3 C)-98.8 F (37.1 C)] 97.3 F (36.3 C) (10/19 1303) Pulse Rate:  [50-73] 50 (10/19 1303) Resp:  [18] 18 (10/19 1303) BP: (93-102)/(50-61) 102/54 mmHg (10/19 1303) SpO2:  [97 %-98 %] 98 % (10/19 1303) Blood pressure 102/54, pulse 50, temperature 97.3 F (36.3 C), temperature source Oral, resp. rate 18, height  (1.702 m), weight 86.728 kg (191 lb 3.2 oz), SpO2 98 %.   Intake/Output from previous day:    Intake/Output this shift:     General appearance:  49 yo male, no acute distress Resp:  bcta Cardio:  rrr without rub or gallop GI:  Soft, generalized tenderness, bowel sounds positive, normoactive. No rebound, Most of the tenderness is in the llq, lower abd and left abd.  Extremities:  No cce   Lab Results: No results found for this or any previous visit (from the past 24 hour(s)).    Recent Labs  01/18/15 1842 01/19/15 0353  WBC 4.3 6.9  HGB 15.0 14.8  HCT 42.6 43.0  PLT 202 161   BMET  Recent Labs  01/18/15 1842 01/19/15 0353  NA 140 143  K 3.3* 3.8  CL 105 106  CO2 27 30  GLUCOSE 93 109*  BUN 7 8  CREATININE 0.87 0.95  CALCIUM 9.2 8.8*   LFT  Recent Labs  01/18/15 1842  PROT 7.2  ALBUMIN 4.3  AST 22  ALT 33  ALKPHOS 68  BILITOT 1.1   PT/INR No results for input(s): LABPROT, INR in the last 72 hours. Hepatitis Panel No results for input(s): HEPBSAG, HCVAB, HEPAIGM, HEPBIGM in the last 72 hours. C-Diff  Recent Labs  01/18/15 1842  CDIFFTOX POSITIVE*   No results for input(s): CDIFFPCR in the last 72 hours.   Studies/Results: Ct Abdomen Pelvis W Contrast  01/18/2015  CLINICAL DATA:  LEFT lower quadrant pain. Recent diagnosis of enteritis. EXAM: CT ABDOMEN AND PELVIS WITH CONTRAST TECHNIQUE: Multidetector CT imaging  of the abdomen and pelvis was performed using the standard protocol following bolus administration of intravenous contrast. CONTRAST:  OMNIPAQUE IOHEXOL 300 MG/ML  SOLN COMPARISON:  CT 01/05/2015 FINDINGS: Lower chest: Lung bases are clear. Hepatobiliary: No focal hepatic lesion. No biliary duct dilatation. Gallbladder is normal. Common bile duct is normal. Pancreas: Pancreas is normal. No ductal dilatation. No pancreatic inflammation. Spleen: Normal spleen Adrenals/urinary tract: Adrenal glands and kidneys are normal. The ureters and bladder normal. Stomach/Bowel: The stomach and duodenum are normal. There is interval resolution of the diffuse small bowel dilatation and bowel inflammation seen on comparison exam. There is normal mucosal pattern of the jejunum and ileum. Contrast flows into the ascending colon. Appendix not identified. Beginning in the descending colon there is a long segment of mild bowel wall thickening and mild pericolonic inflammation. This extends into the proximal sigmoid colon (images 45 through 73 of series 2). This LEFT colon inflammation is also seen on coronal image 73 and 55. There is mild bowel wall thickening in the rectum. Vascular/Lymphatic: Abdominal aorta is normal caliber. There is no retroperitoneal or periportal lymphadenopathy. No pelvic lymphadenopathy. Reproductive: Normal prostate Musculoskeletal: SI joints appear normal. No aggressive osseous lesion. Other: Bilateral fat filled inguinal hernias. IMPRESSION: Long segment of descending colon and proximal sigmoid colon inflammation consistent with segmental colitis. Differential would include inflammatory bowel disease, infectious  colitis, drug induced colitis, and less likely ischemic colitis. Resolution of enteritis seen on comparison exam. Electronically Signed   By: Genevive BiStewart  Edmunds M.D.   On: 01/18/2015 20:15    Scheduled Inpatient Medications:   . enoxaparin (LOVENOX) injection  40 mg Subcutaneous Q24H  .  famotidine  20 mg Oral BID  . Influenza vac split quadrivalent PF  0.5 mL Intramuscular Tomorrow-1000  . vancomycin  500 mg Oral 4 times per day    Continuous Inpatient Infusions:   . sodium chloride 125 mL/hr at 01/20/15 1435    PRN Inpatient Medications:  acetaminophen **OR** acetaminophen, morphine injection, ondansetron **OR** ondansetron (ZOFRAN) IV, oxyCODONE-acetaminophen  Miscellaneous:   Assessment:  1) c diff colitis, on po vancomycin  Plan:  Continue current. We'll follow  Christena DeemMartin U Skulskie MD 01/20/2015, 4:34 PM

## 2015-01-20 NOTE — Progress Notes (Signed)
Patient ID: Jeremy Spence, male   DOB: 28-Oct-1965, 49 y.o.   MRN: [id number] [facility] PROGRESS NOTE  [facility]P: [facility]  HPI/Subjective: Patient with 13 episodes of diarrhea yesterday. When I saw him earlier he already had 6 bowel movements today. Patient feeling abdominal pain mostly in the left abdomen severe in nature.  Objective: Filed Vitals:   01/20/15 1303  BP: 102/54  Pulse: 50  Temp: 97.3 F (36.3 C)  Resp: 18   No intake or output data in the 24 hours ending 01/20/15 1415 Filed Weights   01/18/15 1618 01/18/15 2345  Weight: 86.183 kg (190 lb) 86.728 kg (191 lb 3.2 oz)    ROS: Review of Systems  Constitutional: Negative for fever and chills.  Eyes: Negative for blurred vision.  Respiratory: Negative for cough and shortness of breath.   Cardiovascular: Negative for chest pain.  Gastrointestinal: Positive for nausea, abdominal pain and diarrhea. Negative for vomiting, constipation and blood in stool.  Genitourinary: Negative for dysuria.  Musculoskeletal: Negative for joint pain.  Neurological: Negative for dizziness and headaches.   Exam: Physical Exam  Constitutional: He is oriented to person, place, and time.  HENT:  Nose: No mucosal edema.  Mouth/Throat: No oropharyngeal exudate or posterior oropharyngeal edema.  Eyes: Conjunctivae, EOM and lids are normal. Pupils are equal, round, and reactive to light.  Neck: No JVD present. Carotid bruit is not present. No edema present. No thyroid mass and no thyromegaly present.  Cardiovascular: S1 normal and S2 normal.  Exam reveals no gallop.   No murmur heard. Pulses:      Dorsalis pedis pulses are 2+ on the right side, and 2+ on the left side.  Respiratory: No respiratory distress. He has no wheezes. He has no rhonchi. He has no rales.  GI: Soft. Bowel sounds are normal. There is no tenderness.  Musculoskeletal:       Right ankle: He exhibits no swelling.       Left ankle: He  exhibits no swelling.  Lymphadenopathy:    He has no cervical adenopathy.  Neurological: He is alert and oriented to person, place, and time. No cranial nerve deficit.  Skin: Skin is warm. No rash noted. Nails show no clubbing.  Psychiatric: He has a normal mood and affect.    Data Reviewed: Basic Metabolic Panel:  Recent Labs Lab 01/18/15 1842 01/19/15 0353  NA 140 143  K 3.3* 3.8  CL 105 106  CO2 27 30  GLUCOSE 93 109*  BUN 7 8  CREATININE 0.87 0.95  CALCIUM 9.2 8.8*  MG 2.0  --   PHOS 4.0  --    Liver Function Tests:  Recent Labs Lab 01/18/15 1842  AST 22  ALT 33  ALKPHOS 68  BILITOT 1.1  PROT 7.2  ALBUMIN 4.3    Recent Labs Lab 01/18/15 1842  LIPASE 20*   CBC:  Recent Labs Lab 01/18/15 1842 01/19/15 0353  WBC 4.3 6.9  NEUTROABS 2.6  --   HGB 15.0 14.8  HCT 42.6 43.0  MCV 92.2 93.1  PLT 202 161     Recent Results (from the past 240 hour(s))  C difficile quick scan w PCR reflex     Status: Abnormal   Collection Time: 01/18/15  6:42 PM  Result Value Ref Range Status   C Diff antigen POSITIVE (A) NEGATIVE Final   C Diff toxin POSITIVE (A) NEGATIVE Final   C Diff interpretation   Final    Positive  for toxigenic C. difficile, active toxin production present.    Comment: CRITICAL RESULT CALLED TO, READ BACK BY AND VERIFIED WITHPaulene Floor:  HENRY RIVERA AT 2046 01/18/15 SDR   Stool culture     Status: None (Preliminary result)   Collection Time: 01/19/15  3:05 AM  Result Value Ref Range Status   Specimen Description STOOL  Final   Special Requests NONE  Final   Culture   Final    NO SALMONELLA OR SHIGELLA ISOLATED NO CAMPYLOBACTER DETECTED    Report Status PENDING  Incomplete     Studies: Ct Abdomen Pelvis W Contrast  01/18/2015  CLINICAL DATA:  LEFT lower quadrant pain. Recent diagnosis of enteritis. EXAM: CT ABDOMEN AND PELVIS WITH CONTRAST TECHNIQUE: Multidetector CT imaging of the abdomen and pelvis was performed using the standard protocol  following bolus administration of intravenous contrast. CONTRAST:  100mL OMNIPAQUE IOHEXOL 300 MG/ML  SOLN COMPARISON:  CT 01/05/2015 FINDINGS: Lower chest: Lung bases are clear. Hepatobiliary: No focal hepatic lesion. No biliary duct dilatation. Gallbladder is normal. Common bile duct is normal. Pancreas: Pancreas is normal. No ductal dilatation. No pancreatic inflammation. Spleen: Normal spleen Adrenals/urinary tract: Adrenal glands and kidneys are normal. The ureters and bladder normal. Stomach/Bowel: The stomach and duodenum are normal. There is interval resolution of the diffuse small bowel dilatation and bowel inflammation seen on comparison exam. There is normal mucosal pattern of the jejunum and ileum. Contrast flows into the ascending colon. Appendix not identified. Beginning in the descending colon there is a long segment of mild bowel wall thickening and mild pericolonic inflammation. This extends into the proximal sigmoid colon (images 45 through 73 of series 2). This LEFT colon inflammation is also seen on coronal image 73 and 55. There is mild bowel wall thickening in the rectum. Vascular/Lymphatic: Abdominal aorta is normal caliber. There is no retroperitoneal or periportal lymphadenopathy. No pelvic lymphadenopathy. Reproductive: Normal prostate Musculoskeletal: SI joints appear normal. No aggressive osseous lesion. Other: Bilateral fat filled inguinal hernias. IMPRESSION: Long segment of descending colon and proximal sigmoid colon inflammation consistent with segmental colitis. Differential would include inflammatory bowel disease, infectious colitis, drug induced colitis, and less likely ischemic colitis. Resolution of enteritis seen on comparison exam. Electronically Signed   By: Genevive BiStewart  Edmunds M.D.   On: 01/18/2015 20:15    Scheduled Meds: . enoxaparin (LOVENOX) injection  40 mg Subcutaneous Q24H  . famotidine  20 mg Oral BID  . Influenza vac split quadrivalent PF  0.5 mL Intramuscular  Tomorrow-1000  . vancomycin  500 mg Oral 4 times per day   Continuous Infusions: . sodium chloride      Assessment/Plan:  1. Clostridium difficile colitis. Patient having too much diarrhea and abdominal pain which is generalized in order to go home at this point. Oral vancomycin. Would like to see diarrhea and abdominal pain settled down first. Oral and IV when necessary pain medication. IV fluid hydration.  2. Relative hypotension- increase IV fluid hydration 3. Gastroesophageal reflux disease without esophagitis- change PPI to H2 blocker 4. History of previous alcohol abuse quit in May.  Code Status:     Code Status Orders        Start     Ordered   01/18/15 2341  Full code   Continuous     01/18/15 2340     Family Communication: Wife at bedside Disposition Plan: To be determined  Consultants:  Gastroenterology  Antibiotics:  Oral vancomycin  Time spent: 20 minutes earlier  Alford HighlandWIETING, Alayziah Tangeman  [facility]

## 2015-01-20 NOTE — Plan of Care (Signed)
Problem: Discharge Progression Outcomes Goal: Other Discharge Outcomes/Goals Plan of care progress to goal: Pain: percocet every 4 hours for abdominal pain with improvement Hemodynamically: VSS Diet: improved appetite Activity: independent

## 2015-01-20 NOTE — Care Management (Signed)
Admitted to [facility] under observation status with the diagnosis of recurrent colitis. C-Diff +. Friend is Alexandria LodgeRobin Hall [phone]) or [phone]). [facility]3 months ago. [facility] with [facility] also. [facility]. Takes care of all basic and instrumental needs himself, drives. Works for [employer]. Good appetite. No falls. Girlfriend will transport home when discharged. Gwenette GreetBrenda S Franck Vinal RN MSN Care Management [phone]

## 2015-01-20 NOTE — Care Management (Signed)
Spoke with Dr. Renae GlossWieting this morning. May need Vancomycin po at discharge. Mr. [name] [facility]. Don't think [facility] will be able to dispense Vancomycin po.  May need to enrolled in the Match Program. No insurance, but does work. Gwenette GreetBrenda S Arlyn Bumpus RN MSN Care management [phone]

## 2015-01-20 NOTE — Plan of Care (Signed)
Problem: Discharge Progression Outcomes Goal: Other Discharge Outcomes/Goals Outcome: Progressing Plan of care progress to goal: BP trending down. Pt complained of no pain this shift. Up ad lib Pt receiving oral vancomycin.

## 2015-01-21 LAB — BASIC METABOLIC PANEL
Anion gap: 3 — ABNORMAL LOW (ref 5–15)
BUN: 7 mg/dL (ref 6–20)
CALCIUM: 8.3 mg/dL — AB (ref 8.9–10.3)
CO2: 28 mmol/L (ref 22–32)
CREATININE: 0.85 mg/dL (ref 0.61–1.24)
Chloride: 108 mmol/L (ref 101–111)
GFR calc non Af Amer: >60 mL/min (ref >60)
Glucose, Bld: 102 mg/dL — ABNORMAL HIGH (ref 65–99)
Potassium: 3.7 mmol/L (ref 3.5–5.1)
SODIUM: 139 mmol/L (ref 135–145)

## 2015-01-21 LAB — GI PATHOGEN PANEL BY PCR, STOOL
CAMPYLOBACTER BY PCR: NOT DETECTED
CRYPTOSPORIDIUM BY PCR: NOT DETECTED
E COLI (ETEC) LT/ST: NOT DETECTED
E COLI 0157 BY PCR: NOT DETECTED
E coli (STEC): NOT DETECTED
G LAMBLIA BY PCR: NOT DETECTED
Norovirus GI/GII: NOT DETECTED
Rotavirus A by PCR: NOT DETECTED
Salmonella by PCR: NOT DETECTED
Shigella by PCR: NOT DETECTED

## 2015-01-21 LAB — STOOL CULTURE

## 2015-01-21 LAB — MAGNESIUM: MAGNESIUM: 1.9 mg/dL (ref 1.7–2.4)

## 2015-01-21 MED ORDER — DIPHENHYDRAMINE HCL 25 MG PO CAPS
25.0000 mg | ORAL_CAPSULE | Freq: Three times a day (TID) | ORAL | Status: DC | PRN
  Administered 2015-01-21: 25 mg via ORAL
  Filled 2015-01-21: qty 1

## 2015-01-21 NOTE — Plan of Care (Signed)
Problem: Discharge Progression Outcomes Goal: Other Discharge Outcomes/Goals Outcome: Progressing Pain medications given for abdominal pain with noted relief. IV fluids infusing. Diarrhea continues. VSS. Tolerating diet well.

## 2015-01-21 NOTE — Plan of Care (Signed)
Problem: Discharge Progression Outcomes Goal: Discharge plan in place and appropriate Alert and oriented pt.  Lives at home with significant other.  Hx of GERD, recurrent C. Dif, colitis   Plan of care progress to goal:  Goal: Other Discharge Outcomes/Goals Treatment plan: Stool sample sent to lab for cultures this shift. Patient C. Dif in ED and on isolation precautions. Plan to increase antibiotic regiment. Pain: Patient with complaints of pain in abdomen.  Percocet Q4 PRN given x 2 this shift. Hemodynamically: Patient afebrile and VSS. BP 104/62 mmHg  Pulse 55  Temp(Src) 98 F (36.7 C) (Oral)  Resp 18  Ht 5\' 7"  (1.702 m)  Wt 191 lb 3.2 oz (86.728 kg)  BMI 29.94 kg/m2  SpO2 98% Complications: Patient with continuing diarrhea this shift. Stool is less watery and beginning to be soft formed Diet: Regular. Patient able to swallow pills whole with water. Activity: Patient up independently in room. Steady on feet and oriented x 4

## 2015-01-21 NOTE — Care Management (Signed)
Per Dr. Renae GlossWieting patient will discharge when medically stable on PO vanc.  I called and spoke with [facility] [place].  Capsules will have an out of pocket cost of $737.  Liquid Vanc will have an out of pocket cost of $103.41. MD will need to write a script for PO liquid vanc at discharge if appropriate.  I have enrolled the patient in the match program.  Will provide patient MATCH card at the time of discharge

## 2015-01-21 NOTE — Progress Notes (Signed)
Patient ID: Jeremy Spence, male   DOB: 1966/01/12, 49 y.o.   MRN: [id number] [facility] PROGRESS NOTE  [facility]P: [facility]  HPI/Subjective: Patient had 10 bowel movements already today. Yesterday it started to slow down and almost started to form. Today diarrhea all liquid. Still having abdominal pain.  Objective: Filed Vitals:   01/21/15 0455  BP: 110/63  Pulse: 49  Temp: 98.4 F (36.9 C)  Resp: 18   No intake or output data in the 24 hours ending 01/21/15 1201 Filed Weights   01/18/15 1618 01/18/15 2345  Weight: 86.183 kg (190 lb) 86.728 kg (191 lb 3.2 oz)    ROS: Review of Systems  Constitutional: Negative for fever and chills.  Eyes: Negative for blurred vision.  Respiratory: Negative for cough and shortness of breath.   Cardiovascular: Negative for chest pain.  Gastrointestinal: Positive for nausea, abdominal pain and diarrhea. Negative for vomiting and constipation.  Genitourinary: Negative for dysuria.  Musculoskeletal: Negative for joint pain.  Neurological: Negative for dizziness and headaches.   Exam: Physical Exam  Constitutional: He is oriented to person, place, and time.  HENT:  Nose: No mucosal edema.  Mouth/Throat: No oropharyngeal exudate or posterior oropharyngeal edema.  Eyes: Conjunctivae, EOM and lids are normal. Pupils are equal, round, and reactive to light.  Neck: No JVD present. Carotid bruit is not present. No edema present. No thyroid mass and no thyromegaly present.  Cardiovascular: S1 normal and S2 normal.  Exam reveals no gallop.   No murmur heard. Pulses:      Dorsalis pedis pulses are 2+ on the right side, and 2+ on the left side.  Respiratory: No respiratory distress. He has no wheezes. He has no rhonchi. He has no rales.  GI: Soft. Bowel sounds are normal. There is tenderness in the left upper quadrant and left lower quadrant.  Musculoskeletal:       Right ankle: He exhibits no swelling.       Left ankle:  He exhibits no swelling.  Lymphadenopathy:    He has no cervical adenopathy.  Neurological: He is alert and oriented to person, place, and time. No cranial nerve deficit.  Skin: Skin is warm. No rash noted. Nails show no clubbing.  Psychiatric: He has a normal mood and affect.    Data Reviewed: Basic Metabolic Panel:  Recent Labs Lab 01/18/15 1842 01/19/15 0353 01/21/15 0639  NA 140 143 139  K 3.3* 3.8 3.7  CL 105 106 108  CO2 GLUCOSE 93 109* 102*  BUN CREATININE 0.87 0.95 0.85  CALCIUM 9.2 8.8* 8.3*  MG 2.0  --  1.9  PHOS 4.0  --   --    Liver Function Tests:  Recent Labs Lab 01/18/15 1842  AST 22  ALT 33  ALKPHOS 68  BILITOT 1.1  PROT 7.2  ALBUMIN 4.3    Recent Labs Lab 01/18/15 1842  LIPASE 20*   CBC:  Recent Labs Lab 01/18/15 1842 01/19/15 0353  WBC 4.3 6.9  NEUTROABS 2.6  --   HGB 15.0 14.8  HCT 42.6 43.0  MCV 92.2 93.1  PLT 202 161     Recent Results (from the past 240 hour(s))  C difficile quick scan w PCR reflex     Status: Abnormal   Collection Time: 01/18/15  6:42 PM  Result Value Ref Range Status   C Diff antigen POSITIVE (A) NEGATIVE Final   C Diff toxin POSITIVE (A)  NEGATIVE Final   C Diff interpretation   Final    Positive for toxigenic C. difficile, active toxin production present.    Comment: CRITICAL RESULT CALLED TO, READ BACK BY AND VERIFIED WITH:  Paulene FloorHENRY RIVERA AT 2046 01/18/15 SDR   Stool culture     Status: None (Preliminary result)   Collection Time: 01/19/15  3:05 AM  Result Value Ref Range Status   Specimen Description STOOL  Final   Special Requests NONE  Final   Culture   Final    NO SALMONELLA OR SHIGELLA ISOLATED NO CAMPYLOBACTER DETECTED No Pathogenic E. coli detected    Report Status PENDING  Incomplete     Scheduled Meds: . enoxaparin (LOVENOX) injection  40 mg Subcutaneous Q24H  . famotidine  20 mg Oral BID  . vancomycin  250 mg Oral 4 times per day   Continuous Infusions: .  sodium chloride 125 mL/hr at 01/21/15 0452    Assessment/Plan:  1. Recurrent Clostridium difficile colitis, abdominal pain left upper and lower quadrants, nausea. Patient having way too much diarrhea and abdominal pain in order to go home at this point. Needs continued watching in the hospital. Oral vancomycin to be continued. Patient will likely need a total of 14 day course. Care manager looking into outpatient prescription options for the patient. 2. Gastroesophageal reflux disease without esophagitis- switched PPI to famotidine  Code Status:     Code Status Orders        Start     Ordered   01/18/15 2341  Full code   Continuous     01/18/15 2340     Family Communication: Wife yesterday Disposition Plan: Home once pain and diarrhea slows down.  Consultants:  Gastrointestinal  Antibiotics:  Oral vancomycin  Time spent: 20 minutes  Alford HighlandWIETING, Shaya Altamura  [facility]

## 2015-01-21 NOTE — Consult Note (Signed)
Pt with C. Diff which is not responding to treatment after 3 1/2 days, on vancomycin but still having multiple loose stools.  Up to 15 today.  No vomiting.  I discussed role of stool transplant for non responders to C. Diff conventional treatment.  If he does not improve by Sunday I will likely do a stool transplant Monday.

## 2015-01-21 NOTE — Progress Notes (Signed)
Dr Elpidio AnisSudini -benadryl 25 mg oral every 8 hours prn for itching

## 2015-01-22 LAB — O&P RESULT

## 2015-01-22 LAB — GIARDIA, EIA; OVA/PARASITE: Giardia Ag, Stl: NEGATIVE

## 2015-01-22 NOTE — Progress Notes (Signed)
Patient ID: Nadean CorwinHarold W Spence, male   DOB: 31-Aug-1965, 49 y.o.   MRN: [id number] [facility] PRO[facility]ESS [facility]  PCP: [facility]  HPI/Subjective: Patient seen earlier in the day. Still not feeling well. Still having lots of diarrhea. Still with abdominal pain rated 6 out of 10 intensity.  Objective: Filed Vitals:   01/22/15 1329  BP: 106/63  Pulse: 58  Temp: 98.6 F (37 C)  Resp: 19    Filed Weights   01/18/15 1618 01/18/15 2345  Weight: 86.183 kg (190 lb) 86.728 kg (191 lb 3.2 oz)    ROS: Review of Systems  Constitutional: Negative for fever and chills.  Eyes: Negative for blurred vision.  Respiratory: Negative for cough and shortness of breath.   Cardiovascular: Negative for chest pain.  Gastrointestinal: Positive for nausea, abdominal pain and diarrhea. Negative for vomiting and constipation.  Genitourinary: Negative for dysuria.  Musculoskeletal: Negative for joint pain.  Neurological: Negative for dizziness and headaches.   Exam: Physical Exam  Constitutional: He is oriented to person, place, and time.  HENT:  Nose: No mucosal edema.  Mouth/Throat: No oropharyngeal exudate or posterior oropharyngeal edema.  Eyes: Conjunctivae, EOM and lids are normal. Pupils are equal, round, and reactive to light.  Neck: No JVD present. Carotid bruit is not present. No edema present. No thyroid mass and no thyromegaly present.  Cardiovascular: S1 normal and S2 normal.  Exam reveals no gallop.   No murmur heard. Pulses:      Dorsalis pedis pulses are 2+ on the right side, and 2+ on the left side.  Respiratory: No respiratory distress. He has no wheezes. He has no rhonchi. He has no rales.  GI: Soft. Bowel sounds are normal. There is tenderness in the left upper quadrant and left lower quadrant.  Musculoskeletal:       Right ankle: He exhibits no swelling.       Left ankle: He exhibits no swelling.  Lymphadenopathy:    He has no cervical adenopathy.   Neurological: He is alert and oriented to person, place, and time. No cranial nerve deficit.  Skin: Skin is warm. No rash noted. Nails show no clubbing.  Psychiatric: He has a normal mood and affect.    Data Reviewed: Basic Metabolic Panel:  Recent Labs Lab 01/18/15 1842 01/19/15 0353 01/21/15 0639  NA 140 143 139  K 3.3* 3.8 3.7  CL 105 106 108  CO2 27 30 28   GLUCOSE 93 109* 102*  BUN 7 8 7   CREATININE 0.87 0.95 0.85  CALCIUM 9.2 8.8* 8.3*  MG 2.0  --  1.9  PHOS 4.0  --   --    Liver Function Tests:  Recent Labs Lab 01/18/15 1842  AST 22  ALT 33  ALKPHOS 68  BILITOT 1.1  PROT 7.2  ALBUMIN 4.3    Recent Labs Lab 01/18/15 1842  LIPASE 20*   CBC:  Recent Labs Lab 01/18/15 1842 01/19/15 0353  WBC 4.3 6.9  NEUTROABS 2.6  --   HGB 15.0 14.8  HCT 42.6 43.0  MCV 92.2 93.1  PLT 202 161     Recent Results (from the past 240 hour(s))  C difficile quick scan w PCR reflex     Status: Abnormal   Collection Time: 01/18/15  6:42 PM  Result Value Ref Range Status   C Diff antigen POSITIVE (A) NEGATIVE Final   C Diff toxin POSITIVE (A) NEGATIVE Final   C Diff interpretation   Final  Positive for toxigenic C. difficile, active toxin production present.    Comment: CRITICAL RESULT CALLED TO, READ BACK BY AND VERIFIED WITH:  Paulene Floor AT 2046 01/18/15 SDR   Stool culture     Status: None   Collection Time: 01/19/15  3:05 AM  Result Value Ref Range Status   Specimen Description STOOL  Final   Special Requests NONE  Final   Culture   Final    NO SALMONELLA OR SHIGELLA ISOLATED NO CAMPYLOBACTER DETECTED No Pathogenic E. coli detected    Report Status 01/21/2015 FINAL  Final     Scheduled Meds: . enoxaparin (LOVENOX) injection  40 mg Subcutaneous Q24H  . famotidine  20 mg Oral BID  . vancomycin  250 mg Oral 4 times per day   Continuous Infusions: . sodium chloride 125 mL/hr at 01/22/15 1235    Assessment/Plan:  1. Recurrent Clostridium  difficile colitis, abdominal pain left upper and lower quadrants, nausea. Patient having way too much diarrhea and abdominal pain in order to go home at this point. Needs continued watching in the hospital. Oral vancomycin to be continued. Patient will likely need a total of 14 day course. Dr. Mechele Collin gastroenterology to consider stool transplant on Monday if still symptomatic. I wrote a prescription of the oral vancomycin for discharge home and gave it to the care manager and family should fill it today. 2. Gastroesophageal reflux disease without esophagitis- switched PPI to famotidine  Code Status:     Code Status Orders        Start     Ordered   01/18/15 2341  Full code   Continuous     01/18/15 2340     Family Communication: Wife yesterday Disposition Plan: Home once pain and diarrhea slows down.  Consultants:  Gastrointestinal  Antibiotics:  Oral vancomycin  Time spent: 20 minutes  Alford Highland  [facility]

## 2015-01-22 NOTE — Care Management (Signed)
Patient has been provided with script for PO vanc capsules, MATCH card, and list of pharmacies.  Patients girlfriend will pick up medication today [facility].  Spoke with pharmacy yesterday and their cost for capsules was $231.  Patient's girlfriend is to notify me when she arrives to get the medication.

## 2015-01-22 NOTE — Plan of Care (Signed)
Problem: Discharge Progression Outcomes Goal: Discharge plan in place and appropriate Alert and oriented pt.  Lives at home with significant other.  Hx of GERD, recurrent C. Dif, colitis   Plan of care progress to goal:  Goal: Other Discharge Outcomes/Goals Treatment plan: Patient C. Dif in ED and on isolation precautions. Plan to increase antibiotic regiment. Pain: Patient with complaints of pain in abdomen. Percocet Q4 PRN given x 2 this shift.  Patient reports that pain worsens after he has diarrhea.   Hemodynamically: Patient afebrile and VSS. BP 122/67 mmHg  Pulse 63  Temp(Src) 98.4 F (36.9 C) (Oral)  Resp 18  Ht 5\' 7"  (1.702 m)  Wt 191 lb 3.2 oz (86.728 kg)  BMI 29.94 kg/m2  SpO2 99% Complications: Patient with continuing diarrhea this shift. Stool is less watery and beginning to be soft formed.  Patient reports that he had 16 stools on Thursday 10/20.  He is keeping track of each stool on his wall board. Diet: Regular. Patient able to swallow pills whole with water. Activity: Patient up independently in room. Steady on feet and oriented x 4

## 2015-01-22 NOTE — Plan of Care (Signed)
Problem: Discharge Progression Outcomes Goal: Pain controlled with appropriate interventions Outcome: Not Progressing Pt continues to abd pain ranging from 6-7.  Getting percocet q4 hrs.  Goal: Hemodynamically stable Outcome: Progressing VSS -  BP:  106/63   Pulse:  58   Temp:  98.6 F (37 C)   Resp:  19               Goal: Complications resolved/controlled Outcome: Not Progressing Pt continues to have multiple loose stools - No N&V. Continues on oral vanc.  If no relief by Sunday -  Possibility of fecal transplant on Monday.   Goal: Tolerating diet Outcome: Progressing Pt on regular diet - tolerates but is running right through him.  Goal: Activity appropriate for discharge plan Outcome: Progressing Pt ambulates independently - Allowed to shower today.   Goal: Other Discharge Outcomes/Goals Outcome: Not Progressing No d/c plan until diarrhea improves.

## 2015-01-22 NOTE — Consult Note (Signed)
Pt still with 10 stools today after 3 days of vancomycin.  I will see him Sunday and if not any better will schedule for stool transplant Monday.  He also has heartburn  And will do EGD at that time. Patient and family in agreement.

## 2015-01-23 NOTE — Progress Notes (Signed)
Progressing VSS. IVF continued.  Percocet given for abdominal pain.  No c/o n/v. Tolerates diet. Several loose stool during the shift per pt.

## 2015-01-23 NOTE — Plan of Care (Signed)
Problem: Discharge Progression Outcomes Goal: Other Discharge Outcomes/Goals Outcome: Progressing VSS. IVF continued. Abdominal pain managed with percocet. No c/o n/v. Tolerates diet. Several loose stool during the shift per pt.

## 2015-01-23 NOTE — Progress Notes (Signed)
GI Inpatient Follow-up Note  Patient Identification: Jeremy CorwinHarold W Spence is a 49 y.o. male with c.diff diarrhea.   Subjective:  Somewhat imrpoved today.  8 stools since midnight which is decreased for him. No rectal bleeding. SOme abd pain, stable.  No n/v.  Scheduled Inpatient Medications:  . enoxaparin (LOVENOX) injection  40 mg Subcutaneous Q24H  . famotidine  20 mg Oral BID  . vancomycin  250 mg Oral 4 times per day    Continuous Inpatient Infusions:   . sodium chloride 125 mL/hr at 01/23/15 1254    PRN Inpatient Medications:  acetaminophen **OR** acetaminophen, diphenhydrAMINE, morphine injection, ondansetron **OR** ondansetron (ZOFRAN) IV, oxyCODONE-acetaminophen    Physical Examination: BP 114/67 mmHg  Pulse 55  Temp(Src) 98.7 F (37.1 C) (Oral)  Resp 19  Ht 5\' 7"  (1.702 m)  Wt 86.728 kg (191 lb 3.2 oz)  BMI 29.94 kg/m2  SpO2 97% Gen: NAD, alert and oriented x 4 Neck: supple, no JVD or thyromegaly Chest: CTA bilaterally, no wheezes, crackles, or other adventitious sounds CV: RRR, no m/g/c/r Abd: soft, + diffuse TTP, ND, +BS in all four quadrants; no HSM, guarding, ridigity, or rebound tenderness Ext: no edema, well perfused with 2+ pulses, Skin: no rash or lesions noted Lymph: no LAD  Data: Lab Results  Component Value Date   WBC 6.9 01/19/2015   HGB 14.8 01/19/2015   HCT 43.0 01/19/2015   MCV 93.1 01/19/2015   PLT 161 01/19/2015    Recent Labs Lab 01/18/15 1842 01/19/15 0353  HGB 15.0 14.8   Lab Results  Component Value Date   NA 139 01/21/2015   K 3.7 01/21/2015   CL 108 01/21/2015   CO2 28 01/21/2015   BUN 7 01/21/2015   CREATININE 0.85 01/21/2015   Lab Results  Component Value Date   ALT 33 01/18/2015   AST 22 01/18/2015   ALKPHOS 68 01/18/2015   BILITOT 1.1 01/18/2015   No results for input(s): APTT, INR, PTT in the last 168 hours.   Assessment/Plan: Mr. 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   Please call with questions or concerns.  Blanca Thornton, Addison NaegeliMATTHEW GORDON, MD

## 2015-01-23 NOTE — Progress Notes (Signed)
Patient ID: Nadean CorwinHarold W Spence, male   DOB: 12-Dec-1965, 49 y.o.   MRN: [id number] [facility] PROGRESS NOTE  PCP: [facility]  HPI/Subjective: Still having multiple episodes of diarrhea had 6 diarrhea since last night.  Objective: Filed Vitals:   01/23/15 0617  BP: 111/72  Pulse: 49  Temp: 98.2 F (36.8 C)  Resp: 18    Filed Weights   01/18/15 1618 01/18/15 2345  Weight: 86.183 kg (190 lb) 86.728 kg (191 lb 3.2 oz)    ROS: Review of Systems  Constitutional: Negative for fever and chills.  Eyes: Negative for blurred vision.  Respiratory: Negative for cough and shortness of breath.   Cardiovascular: Negative for chest pain.  Gastrointestinal: Positive for nausea, abdominal pain and diarrhea. Negative for vomiting and constipation.  Genitourinary: Negative for dysuria.  Musculoskeletal: Negative for joint pain.  Neurological: Negative for dizziness and headaches.   Exam: Physical Exam  Constitutional: He is oriented to person, place, and time.  HENT:  Nose: No mucosal edema.  Mouth/Throat: No oropharyngeal exudate or posterior oropharyngeal edema.  Eyes: Conjunctivae, EOM and lids are normal. Pupils are equal, round, and reactive to light.  Neck: No JVD present. Carotid bruit is not present. No edema present. No thyroid mass and no thyromegaly present.  Cardiovascular: S1 normal and S2 normal.  Exam reveals no gallop.   No murmur heard. Pulses:      Dorsalis pedis pulses are 2+ on the right side, and 2+ on the left side.  Respiratory: No respiratory distress. He has no wheezes. He has no rhonchi. He has no rales.  GI: Soft. Bowel sounds are normal. There is tenderness in the left upper quadrant and left lower quadrant.  Musculoskeletal:       Right ankle: He exhibits no swelling.       Left ankle: He exhibits no swelling.  Lymphadenopathy:    He has no cervical adenopathy.  Neurological: He is alert and oriented to person, place, and time. No  cranial nerve deficit.  Skin: Skin is warm. No rash noted. Nails show no clubbing.  Psychiatric: He has a normal mood and affect.    Data Reviewed: Basic Metabolic Panel:  Recent Labs Lab 01/18/15 1842 01/19/15 0353 01/21/15 0639  NA 140 143 139  K 3.3* 3.8 3.7  CL 105 106 108  CO2 27 30 28   GLUCOSE 93 109* 102*  BUN 7 8 7   CREATININE 0.87 0.95 0.85  CALCIUM 9.2 8.8* 8.3*  MG 2.0  --  1.9  PHOS 4.0  --   --    Liver Function Tests:  Recent Labs Lab 01/18/15 1842  AST 22  ALT 33  ALKPHOS 68  BILITOT 1.1  PROT 7.2  ALBUMIN 4.3    Recent Labs Lab 01/18/15 1842  LIPASE 20*   CBC:  Recent Labs Lab 01/18/15 1842 01/19/15 0353  WBC 4.3 6.9  NEUTROABS 2.6  --   HGB 15.0 14.8  HCT 42.6 43.0  MCV 92.2 93.1  PLT 202 161     Recent Results (from the past 240 hour(s))  C difficile quick scan w PCR reflex     Status: Abnormal   Collection Time: 01/18/15  6:42 PM  Result Value Ref Range Status   C Diff antigen POSITIVE (A) NEGATIVE Final   C Diff toxin POSITIVE (A) NEGATIVE Final   C Diff interpretation   Final    Positive for toxigenic C. difficile, active toxin production present.  Comment: CRITICAL RESULT CALLED TO, READ BACK BY AND VERIFIED WITH:  Paulene Floor AT 2046 01/18/15 SDR   Giardia, EIA; Ova/Parasite     Status: None   Collection Time: 01/19/15  3:05 AM  Result Value Ref Range Status   Ova + Parasite Exam Final report  Final    Comment: (NOTE) These results were obtained using wet preparation(s) and trichrome stained smear. This test does not include testing for Cryptosporidium parvum, Cyclospora, or Microsporidia.    Giardia Ag, Stl Negative Negative Final    Comment: (NOTE) Performed At: [facility] [address] [place], [place] [id number] Andria Rhein R MD [id number] Performed At: [facility] [address] [place], [place] [id number] Mila Homer MD [id number]   Stool culture     Status: None    Collection Time: 01/19/15  3:05 AM  Result Value Ref Range Status   Specimen Description STOOL  Final   Special Requests NONE  Final   Culture   Final    NO SALMONELLA OR SHIGELLA ISOLATED NO CAMPYLOBACTER DETECTED No Pathogenic E. coli detected    Report Status 01/21/2015 FINAL  Final     Scheduled Meds: . enoxaparin (LOVENOX) injection  40 mg Subcutaneous Q24H  . famotidine  20 mg Oral BID  . vancomycin  250 mg Oral 4 times per day   Continuous Infusions: . sodium chloride 125 mL/hr at 01/23/15 0506    Assessment/Plan:  1. Recurrent Clostridium difficile colitis, abdominal pain left upper and lower quadrants, nausea. Patient having way too much diarrhea and abdominal pain in order to go home at this point. Needs continued watching in the hospital. Oral vancomycin to be continued. Patient will likely need a total of 14 day course. Dr. Mechele Collin gastroenterology to consider stool transplant on Monday 2. Gastroesophageal reflux disease without esophagitis- switched PPI to famotidine  Code Status:     Code Status Orders        Start     Ordered   01/18/15 2341  Full code   Continuous     01/18/15 2340     Family Communication: Wife yesterday Disposition Plan: Home once pain and diarrhea slows down.  Consultants:  Gastrointestinal  Antibiotics:  Oral vancomycin  Time spent: 20 minutes  Jeremy Spence, [facility]  [facility] Hospitalists

## 2015-01-23 NOTE — Plan of Care (Signed)
Problem: Discharge Progression Outcomes Goal: Other Discharge Outcomes/Goals Plan of care progress to goal: Pain: requesting pain medication x2 this shift with improvement VSS Diet: tolerating Activity: independent

## 2015-01-24 NOTE — Progress Notes (Signed)
Clinical Child psychotherapistocial Worker (CSW) received consult for assistance with medication. RN Case Manager aware and involved in case. Please reconsult if future social work needs arise. CSW signing off.   Jetta LoutBailey Morgan, LCSWA [phone]

## 2015-01-24 NOTE — Plan of Care (Signed)
Problem: Discharge Progression Outcomes Goal: Other Discharge Outcomes/Goals Outcome: Progressing Plan of care progress to goal for: 1. Pain-pt c/o pain in abdomen, prn meds given with improvement, pt resting comfortably in bed 2. Hemodynamically-             -VSS, afebrile             -IVF continue 3. Complications-no evidence of  4. Diet-pt tolerating diet this shift 5. Activity-moderate fall, pt is independent

## 2015-01-24 NOTE — Progress Notes (Signed)
Patient ID: Jeremy Spence, male   DOB: 11/30/1965, 49 y.o.   MRN: [id number] [facility] PROGRESS NOTE  [facility]P: [facility]  HPI/Subjective: Continues to have diarrhea and abdominal cramping. Objective: Filed Vitals:   01/24/15 0430  BP: 113/69  Pulse: 53  Temp: 98.1 F (36.7 C)  Resp: 18    Filed Weights   01/18/15 1618 01/18/15 2345  Weight: 86.183 kg (190 lb) 86.728 kg (191 lb 3.2 oz)    ROS: Review of Systems  Constitutional: Negative for fever and chills.  Eyes: Negative for blurred vision.  Respiratory: Negative for cough and shortness of breath.   Cardiovascular: Negative for chest pain.  Gastrointestinal: Positive for nausea, abdominal pain and diarrhea. Negative for vomiting and constipation.  Genitourinary: Negative for dysuria.  Musculoskeletal: Negative for joint pain.  Neurological: Negative for dizziness and headaches.   Exam: Physical Exam  Constitutional: He is oriented to person, place, and time.  HENT:  Nose: No mucosal edema.  Mouth/Throat: No oropharyngeal exudate or posterior oropharyngeal edema.  Eyes: Conjunctivae, EOM and lids are normal. Pupils are equal, round, and reactive to light.  Neck: No JVD present. Carotid bruit is not present. No edema present. No thyroid mass and no thyromegaly present.  Cardiovascular: S1 normal and S2 normal.  Exam reveals no gallop.   No murmur heard. Pulses:      Dorsalis pedis pulses are 2+ on the right side, and 2+ on the left side.  Respiratory: No respiratory distress. He has no wheezes. He has no rhonchi. He has no rales.  GI: Soft. Bowel sounds are normal. There is tenderness in the left upper quadrant and left lower quadrant.  Musculoskeletal:       Right ankle: He exhibits no swelling.       Left ankle: He exhibits no swelling.  Lymphadenopathy:    He has no cervical adenopathy.  Neurological: He is alert and oriented to person, place, and time. No cranial nerve deficit.   Skin: Skin is warm. No rash noted. Nails show no clubbing.  Psychiatric: He has a normal mood and affect.    Data Reviewed: Basic Metabolic Panel:  Recent Labs Lab 01/18/15 1842 01/19/15 0353 01/21/15 0639  NA 140 143 139  K 3.3* 3.8 3.7  CL 105 106 108  CO2 GLUCOSE 93 109* 102*  BUN CREATININE 0.87 0.95 0.85  CALCIUM 9.2 8.8* 8.3*  MG 2.0  --  1.9  PHOS 4.0  --   --    Liver Function Tests:  Recent Labs Lab 01/18/15 1842  AST 22  ALT 33  ALKPHOS 68  BILITOT 1.1  PROT 7.2  ALBUMIN 4.3    Recent Labs Lab 01/18/15 1842  LIPASE 20*   CBC:  Recent Labs Lab 01/18/15 1842 01/19/15 0353  WBC 4.3 6.9  NEUTROABS 2.6  --   HGB 15.0 14.8  HCT 42.6 43.0  MCV 92.2 93.1  PLT 202 161     Recent Results (from the past 240 hour(s))  C difficile quick scan w PCR reflex     Status: Abnormal   Collection Time: 01/18/15  6:42 PM  Result Value Ref Range Status   C Diff antigen POSITIVE (A) NEGATIVE Final   C Diff toxin POSITIVE (A) NEGATIVE Final   C Diff interpretation   Final    Positive for toxigenic C. difficile, active toxin production present.    Comment: CRITICAL RESULT CALLED TO, READ  BACK BY AND VERIFIED WITH:  Paulene FloorHENRY RIVERA AT 2046 01/18/15 SDR   Giardia, EIA; Ova/Parasite     Status: None   Collection Time: 01/19/15  3:05 AM  Result Value Ref Range Status   Ova + Parasite Exam Final report  Final    Comment: (NOTE) These results were obtained using wet preparation(s) and trichrome stained smear. This test does not include testing for Cryptosporidium parvum, Cyclospora, or Microsporidia.    Giardia Ag, Stl Negative Negative Final    Comment: (NOTE) Performed At: [facility] [place] [id number] [place], [place] [id number] Andria Rheiniley Celeste R MD [id number]:[phone] Performed At: [facility]  [address] [place], [place] [id number] Mila HomerHancock William F MD [id number]:[phone]   Stool culture     Status: None   Collection Time:  01/19/15  3:05 AM  Result Value Ref Range Status   Specimen Description STOOL  Final   Special Requests NONE  Final   Culture   Final    NO SALMONELLA OR SHIGELLA ISOLATED NO CAMPYLOBACTER DETECTED No Pathogenic E. coli detected    Report Status 01/21/2015 FINAL  Final     Scheduled Meds: . enoxaparin (LOVENOX) injection  40 mg Subcutaneous Q24H  . famotidine  20 mg Oral BID  . vancomycin  250 mg Oral 4 times per day   Continuous Infusions: . sodium chloride 125 mL/hr at 01/24/15 0426    Assessment/Plan:  1. Recurrent Clostridium difficile colitis, abdominal pain continue vancomycin, plan for fecal stool implant tomorrow per GI 2. Gastroesophageal reflux disease without esophagitis- switched PPI to famotidine  Code Status:     Code Status Orders        Start     Ordered   01/18/15 2341  Full code   Continuous     01/18/15 2340     Family Communication: Wife yesterday Disposition Plan: Home once pain and diarrhea slows down.  Consultants:  Gastrointestinal  Antibiotics:  Oral vancomycin  Time spent: 20 minutes  Jenavie Stanczak, [facility]  [facility]

## 2015-01-24 NOTE — Consult Note (Signed)
Pt with continued diarrhea despite vancomycin.  Will do flex sig tomorrow.  Will hold on stool transplant for now. Possible coexisting infection, IBD.  May broaden antibiotic coverage after flex.  Will need him off vancomycin for a day if do stool transplant. Pt understands.

## 2015-01-24 NOTE — Plan of Care (Signed)
Problem: Discharge Progression Outcomes Goal: Other Discharge Outcomes/Goals Outcome: Progressing VSS. IVF continued.  Percocet given for abdominal pain.  No c/o n/v. Tolerates diet. Several loose stool during the shift per pt.

## 2015-01-25 ENCOUNTER — Encounter: Payer: Self-pay | Admitting: *Deleted

## 2015-01-25 ENCOUNTER — Encounter
Admission: EM | Disposition: A | Discharge status: Home / Self Care | Payer: Self-pay | Source: Home / Self Care | Attending: Internal Medicine

## 2015-01-25 ENCOUNTER — Ambulatory Visit: Admission: AD | Admit: 2015-01-25 | Payer: Self-pay | Admitting: Gastroenterology

## 2015-01-25 HISTORY — PX: FLEXIBLE SIGMOIDOSCOPY: SHX5431

## 2015-01-25 LAB — BASIC METABOLIC PANEL
ANION GAP: 5 (ref 5–15)
BUN: 8 mg/dL (ref 6–20)
CHLORIDE: 105 mmol/L (ref 101–111)
CO2: 29 mmol/L (ref 22–32)
CREATININE: 0.88 mg/dL (ref 0.61–1.24)
Calcium: 8.8 mg/dL — ABNORMAL LOW (ref 8.9–10.3)
GFR calc non Af Amer: >60 mL/min (ref >60)
Glucose, Bld: 94 mg/dL (ref 65–99)
Potassium: 3.8 mmol/L (ref 3.5–5.1)
SODIUM: 139 mmol/L (ref 135–145)

## 2015-01-25 LAB — CBC WITH DIFFERENTIAL/PLATELET
BASOS PCT: 1 %
Basophils Absolute: 0 10*3/uL (ref 0–0.1)
EOS ABS: 0.4 10*3/uL (ref 0–0.7)
Eosinophils Relative: 6 %
HCT: 41.6 % (ref 40.0–52.0)
HEMOGLOBIN: 14.7 g/dL (ref 13.0–18.0)
LYMPHS ABS: 1.4 10*3/uL (ref 1.0–3.6)
Lymphocytes Relative: 21 %
MCH: 32.1 pg (ref 26.0–34.0)
MCHC: 35.3 g/dL (ref 32.0–36.0)
MCV: 90.9 fL (ref 80.0–100.0)
Monocytes Absolute: 0.6 10*3/uL (ref 0.2–1.0)
Monocytes Relative: 9 %
NEUTROS ABS: 4.1 10*3/uL (ref 1.4–6.5)
NEUTROS PCT: 63 %
Platelets: 218 10*3/uL (ref 150–440)
RBC: 4.58 MIL/uL (ref 4.40–5.90)
RDW: 12.4 % (ref 11.5–14.5)
WBC: 6.5 10*3/uL (ref 3.8–10.6)

## 2015-01-25 LAB — PROTIME-INR
INR: 0.98
PROTHROMBIN TIME: 13.2 s (ref 11.4–15.0)

## 2015-01-25 LAB — CREATININE, SERUM
CREATININE: 0.85 mg/dL (ref 0.61–1.24)
GFR calc Af Amer: >60 mL/min (ref >60)
GFR calc non Af Amer: >60 mL/min (ref >60)

## 2015-01-25 SURGERY — SIGMOIDOSCOPY, FLEXIBLE
Anesthesia: Moderate Sedation

## 2015-01-25 SURGERY — SIGMOIDOSCOPY, FLEXIBLE
Anesthesia: General

## 2015-01-25 MED ORDER — SODIUM CHLORIDE 0.9 % IV SOLN
1000.0000 mL | INTRAVENOUS | Status: DC
Start: 2015-01-25 — End: 2015-01-25
  Administered 2015-01-25: 1000 mL via INTRAVENOUS

## 2015-01-25 MED ORDER — MIDAZOLAM HCL 5 MG/5ML IJ SOLN
INTRAMUSCULAR | Status: DC | PRN
  Administered 2015-01-25: 1 mg via INTRAVENOUS

## 2015-01-25 MED ORDER — FENTANYL CITRATE (PF) 100 MCG/2ML IJ SOLN
INTRAMUSCULAR | Status: AC
  Administered 2015-01-25: 18:00:00
  Filled 2015-01-25: qty 2

## 2015-01-25 MED ORDER — FENTANYL CITRATE (PF) 100 MCG/2ML IJ SOLN
INTRAMUSCULAR | Status: DC | PRN
  Administered 2015-01-25: 25 ug via INTRAVENOUS

## 2015-01-25 MED ORDER — MIDAZOLAM HCL 5 MG/5ML IJ SOLN
INTRAMUSCULAR | Status: DC | PRN
  Administered 2015-01-25: 2 mg via INTRAVENOUS

## 2015-01-25 MED ORDER — MIDAZOLAM HCL 5 MG/5ML IJ SOLN
INTRAMUSCULAR | Status: AC
  Administered 2015-01-25: 18:00:00
  Filled 2015-01-25: qty 5

## 2015-01-25 NOTE — Progress Notes (Signed)
Pt currently off  Unit  Having flex sig  Performed.  Prn po earlier  Today for c/o of lower abd pain  With slight  Improvement.

## 2015-01-25 NOTE — Plan of Care (Addendum)
Problem: Discharge Progression Outcomes Goal: Other Discharge Outcomes/Goals Outcome: Progressing 1. C/o abdominal pain relieved by PRN Oxycodone.  2. Hemodynamically: VSS, afebrile, IVF infusing as ordered, PO Vanc Q6H, no loose stools this shift so far 3. Flex. Sig today- consent signed & pt ready, NPO starting at 5AM per order.  4. Ambulates safely in room independently. Understands how to call for assistance if needed.

## 2015-01-25 NOTE — Op Note (Signed)
[facility] Gastroenterology Patient Name: Jeremy Spence Procedure Date: 01/25/2015 5:56 PM MRN: [id number] Account #: [id number] Date of Birth: [birth year] Admit Type: Inpatient Age: 49 Room: [facility] ENDO ROOM 3 Gender: Male Note Status: Finalized Procedure:         Flexible Sigmoidoscopy Indications:       Infectious diarrhea Providers:         Christena Deem, MD Referring MD:      Dr Leonette Most [facility], MD (Referring MD) Medicines:         Fentanyl 50 micrograms IV, Midazolam 4 mg IV Complications:     No immediate complications. Procedure:         Pre-Anesthesia Assessment:                    - ASA Grade Assessment: II - A patient with mild systemic                     disease.                    After obtaining informed consent, the scope was passed                     under direct vision. The Colonoscope was introduced                     through the anus and advanced to the 35 cm from the anal                     verge. The flexible sigmoidoscopy was accomplished without                     difficulty. The patient tolerated the procedure well. The                     quality of the bowel preparation was good. Findings:      The digital rectal exam was normal.      An area of mildly congested mucosa was found in the rectum and in the       sigmoid colon. Impression:        - Congested mucosa in the rectum and in the sigmoid colon.                     No overt appearance of c diff.                    - Diffuse and patchy mild inflammation was found in the                     recto-sigmoid colon. Biopsied. Recommendation:    - Continue present medications.                    - Return patient to hospital ward for ongoing care. Procedure Code(s): --- Professional ---                    [phone], Sigmoidoscopy, flexible; diagnostic, including                     collection of specimen(s) by brushing or washing, when                     performed (separate  procedure) Diagnosis Code(s): --- Professional ---  569.49, Other specified disorders of rectum and anus                    569.89, Other specified disorders of intestine                    558.9, Other and unspecified noninfectious gastroenteritis                     and colitis                    009.2, Infectious diarrhea CPT copyright 2014 American Medical Association. All rights reserved. The codes documented in this report are preliminary and upon coder review may  be revised to meet current compliance requirements. Christena DeemMartin U Avanish Cerullo, MD 01/25/2015 6:28:12 PM This report has been signed electronically. Number of Addenda: 0 Note Initiated On: 01/25/2015 5:56 PM      [facility]

## 2015-01-25 NOTE — Consult Note (Signed)
Subjective: Patient seen for C. difficile colitis. She has only slowly getting better with medication. Currently on oral vancomycin. Is no nausea or vomiting. Continues to be a generalized abdominal discomfort mostly in the lower abdomen. He's had no fever. It is of note that his erosive stools daily have been decreasing consistently slowly.  Objective: Vital signs in last 24 hours: Temp:  [96.1 F (35.6 C)-98.3 F (36.8 C)] 96.1 F (35.6 C) (10/24 1638) Pulse Rate:  [47-53] 53 (10/24 1638) Resp:  [18-20] 20 (10/24 1638) BP: (108-140)/(63-82) 140/72 mmHg (10/24 1638) SpO2:  [96 %-99 %] 99 % (10/24 1338) Blood pressure 140/72, pulse 53, temperature 96.1 F (35.6 C), temperature source Tympanic, resp. rate 20, height  (1.702 m), weight 86.728 kg (191 lb 3.2 oz), SpO2 99 %.   Intake/Output from previous day: 10/23 0701 - 10/24 0700 In: 2766.7 [I.V.:2766.7] Out: -   Intake/Output this shift:     General appearance:  49 year old male no acute distress Resp:  biLaterally clear to auscultation Cardio:  Regular rate and rhythm without rub or gallop GI:  Soft, generalized tenderness to palpation no rebound. Most of the discomfort is in the left lower quadrant. Bowel sounds are positive and hyperactive. Extremities:  No clubbing cyanosis or edema   Lab Results: Results for orders placed or performed during the hospital encounter of 01/18/15 (from the past 24 hour(s))  Creatinine, serum     Status: None   Collection Time: 01/25/15  4:25 AM  Result Value Ref Range   Creatinine, Ser 0.85 0.61 - 1.24 mg/dL   GFR calc non Af Amer >60 >60 mL/min   GFR calc Af Amer >60 >60 mL/min  Basic metabolic panel     Status: Abnormal   Collection Time: 01/25/15 12:37 PM  Result Value Ref Range   Sodium 139 135 - 145 mmol/L   Potassium 3.8 3.5 - 5.1 mmol/L   Chloride 105 101 - 111 mmol/L   CO2 29 22 - 32 mmol/L   Glucose, Bld 94 65 - 99 mg/dL   BUN 8 6 - 20 mg/dL   Creatinine, Ser 2.44 0.61  - 1.24 mg/dL   Calcium 8.8 (L) 8.9 - 10.3 mg/dL   GFR calc non Af Amer >60 >60 mL/min   GFR calc Af Amer >60 >60 mL/min   Anion gap 5 5 - 15  CBC with Differential/Platelet     Status: None   Collection Time: 01/25/15  1:02 PM  Result Value Ref Range   WBC 6.5 3.8 - 10.6 K/uL   RBC 4.58 4.40 - 5.90 MIL/uL   Hemoglobin 14.7 13.0 - 18.0 g/dL   HCT 01.0 27.2 - 53.6 %   MCV 90.9 80.0 - 100.0 fL   MCH 32.1 26.0 - 34.0 pg   MCHC 35.3 32.0 - 36.0 g/dL   RDW 64.4 03.4 - 74.2 %   Platelets 218 150 - 440 K/uL   Neutrophils Relative % 63 %   Neutro Abs 4.1 1.4 - 6.5 K/uL   Lymphocytes Relative 21 %   Lymphs Abs 1.4 1.0 - 3.6 K/uL   Monocytes Relative 9 %   Monocytes Absolute 0.6 0.2 - 1.0 K/uL   Eosinophils Relative 6 %   Eosinophils Absolute 0.4 0 - 0.7 K/uL   Basophils Relative 1 %   Basophils Absolute 0.0 0 - 0.1 K/uL  Protime-INR     Status: None   Collection Time: 01/25/15  1:02 PM  Result Value Ref Range   Prothrombin  Time 13.2 11.4 - 15.0 seconds   INR 0.98       Recent Labs  01/25/15 1302  WBC 6.5  HGB 14.7  HCT 41.6  PLT 218   BMET  Recent Labs  01/25/15 0425 01/25/15 1237  NA  --  139  K  --  3.8  CL  --  105  CO2  --  29  GLUCOSE  --  94  BUN  --  8  CREATININE 0.85 0.88  CALCIUM  --  8.8*   LFT No results for input(s): PROT, ALBUMIN, AST, ALT, ALKPHOS, BILITOT, BILIDIR, IBILI in the last 72 hours. PT/INR  Recent Labs  01/25/15 1302  LABPROT 13.2  INR 0.98   Hepatitis Panel No results for input(s): HEPBSAG, HCVAB, HEPAIGM, HEPBIGM in the last 72 hours. C-Diff No results for input(s): CDIFFTOX in the last 72 hours. No results for input(s): CDIFFPCR in the last 72 hours.   Studies/Results: No results found.  Scheduled Inpatient Medications:   . [MAR Hold] enoxaparin (LOVENOX) injection  40 mg Subcutaneous Q24H  . [MAR Hold] famotidine  20 mg Oral BID  . fentaNYL      . midazolam      . [MAR Hold] vancomycin  250 mg Oral 4 times per day     Continuous Inpatient Infusions:   . sodium chloride 125 mL/hr at 01/25/15 1111  . sodium chloride 1,000 mL (01/25/15 1733)    PRN Inpatient Medications:  [MAR Hold] acetaminophen **OR** [MAR Hold] acetaminophen, [MAR Hold] diphenhydrAMINE, [MAR Hold]  morphine injection, [MAR Hold] ondansetron **OR** [MAR Hold] ondansetron (ZOFRAN) IV, [MAR Hold] oxyCODONE-acetaminophen  Miscellaneous:   Assessment:  1. C difficile colitis, slowly resolving. Uncertain for possible secondary organism or other etiologies such as inflammatory Bowel disease.  Plan:  1. Continue current 2. Sedated flexible sigmoidoscopy today further recommendations to follow. I have discussed the risks benefits and complications of procedures to include not limited to bleeding, infection, perforation and the risk of sedation and the patient wishes to proceed.  Christena DeemMartin U Pavlos Yon MD 01/25/2015, 5:56 PM

## 2015-01-25 NOTE — Progress Notes (Signed)
Pt had 2 tap water enemas  This pm  Per dr Ricki Rodriguezskulski.  Good results from both  Golden brown solution expelled .went th

## 2015-01-25 NOTE — Progress Notes (Signed)
Patient ID: Nadean CorwinHarold W Cory, male   DOB: [birth year], 49 y.o.   MRN: [id number] [facility] PRO[facility]  PCP: [facility]  HPI/Subjective:  Decreased diarrhea has abdominal pain plan for sigmoidoscopy later today Objective: Filed Vitals:   01/25/15 1338  BP: 125/82  Pulse: 47  Temp: 98.3 F (36.8 C)  Resp: 20    Filed Weights   01/18/15 1618 01/18/15 2345  Weight: 86.183 kg (190 lb) 86.728 kg (191 lb 3.2 oz)    ROS: Review of Systems  Constitutional: Negative for fever and chills.  Eyes: Negative for blurred vision.  Respiratory: Negative for cough and shortness of breath.   Cardiovascular: Negative for chest pain.  Gastrointestinal: Positive for nausea, abdominal pain and decreased diarrhea. Negative for vomiting and constipation.  Genitourinary: Negative for dysuria.  Musculoskeletal: Negative for joint pain.  Neurological: Negative for dizziness and headaches.   Exam: Physical Exam  Constitutional: He is oriented to person, place, and time.  HENT:  Nose: No mucosal edema.  Mouth/Throat: No oropharyngeal exudate or posterior oropharyngeal edema.  Eyes: Conjunctivae, EOM and lids are normal. Pupils are equal, round, and reactive to light.  Neck: No JVD present. Carotid bruit is not present. No edema present. No thyroid mass and no thyromegaly present.  Cardiovascular: S1 normal and S2 normal.  Exam reveals no gallop.   No murmur heard. Pulses:      Dorsalis pedis pulses are 2+ on the right side, and 2+ on the left side.  Respiratory: No respiratory distress. He has no wheezes. He has no rhonchi. He has no rales.  GI: Soft. Bowel sounds are normal. There is tenderness in the left upper quadrant and left lower quadrant.  Musculoskeletal:       Right ankle: He exhibits no swelling.       Left ankle: He exhibits no swelling.  Lymphadenopathy:    He has no cervical adenopathy.  Neurological: He is alert and oriented to person, place, and  time. No cranial nerve deficit.  Skin: Skin is warm. No rash noted. Nails show no clubbing.  Psychiatric: He has a normal mood and affect.    Data Reviewed: Basic Metabolic Panel:  Recent Labs Lab 01/18/15 1842 01/19/15 0353 01/21/15 0639 01/25/15 0425 01/25/15 1237  NA 140 143 139  --  139  K 3.3* 3.8 3.7  --  3.8  CL 105 106 108  --  105  CO2 27 30 28   --  29  GLUCOSE 93 109* 102*  --  94  BUN 7 8 7   --  8  CREATININE 0.87 0.95 0.85 0.85 0.88  CALCIUM 9.2 8.8* 8.3*  --  8.8*  MG 2.0  --  1.9  --   --   PHOS 4.0  --   --   --   --    Liver Function Tests:  Recent Labs Lab 01/18/15 1842  AST 22  ALT 33  ALKPHOS 68  BILITOT 1.1  PROT 7.2  ALBUMIN 4.3    Recent Labs Lab 01/18/15 1842  LIPASE 20*   CBC:  Recent Labs Lab 01/18/15 1842 01/19/15 0353 01/25/15 1302  WBC 4.3 6.9 6.5  NEUTROABS 2.6  --  4.1  HGB 15.0 14.8 14.7  HCT 42.6 43.0 41.6  MCV 92.2 93.1 90.9  PLT 202 161 218     Recent Results (from the past 240 hour(s))  C difficile quick scan w PCR reflex     Status: Abnormal  Collection Time: 01/18/15  6:42 PM  Result Value Ref Range Status   C Diff antigen POSITIVE (A) NEGATIVE Final   C Diff toxin POSITIVE (A) NEGATIVE Final   C Diff interpretation   Final    Positive for toxigenic C. difficile, active toxin production present.    Comment: CRITICAL RESULT CALLED TO, READ BACK BY AND VERIFIED WITH:  Paulene Floor AT 2046 01/18/15 SDR   Giardia, EIA; Ova/Parasite     Status: None   Collection Time: 01/19/15  3:05 AM  Result Value Ref Range Status   Ova + Parasite Exam Final report  Final    Comment: (NOTE) These results were obtained using wet preparation(s) and trichrome stained smear. This test does not include testing for Cryptosporidium parvum, Cyclospora, or Microsporidia.    Giardia Ag, Stl Negative Negative Final    Comment: (NOTE) Performed At: [facility] [address] [place], [place] [id number] Andria Rhein R  MD [id number] Performed At: [facility] [address] [place], [place] [id number] Mila Homer MD [id number]   Stool culture     Status: None   Collection Time: 01/19/15  3:05 AM  Result Value Ref Range Status   Specimen Description STOOL  Final   Special Requests NONE  Final   Culture   Final    NO SALMONELLA OR SHIGELLA ISOLATED NO CAMPYLOBACTER DETECTED No Pathogenic E. coli detected    Report Status 01/21/2015 FINAL  Final     Scheduled Meds: . enoxaparin (LOVENOX) injection  40 mg Subcutaneous Q24H  . famotidine  20 mg Oral BID  . vancomycin  250 mg Oral 4 times per day   Continuous Infusions: . sodium chloride 125 mL/hr at 01/25/15 1111    Assessment/Plan:  1. Recurrent Clostridium difficile colitis, abdominal pain continue vancomycin, plan for sigmoidoscopy with biopsy stool transplant on hold for now 2. Gastroesophageal reflux disease without esophagitis- switched PPI to famotidine  Code Status:     Code Status Orders        Start     Ordered   01/18/15 2341  Full code   Continuous     01/18/15 2340     Family Communication: Wife yesterday Disposition Plan: Home once pain and diarrhea slows down.  Consultants:  Gastrointestinal  Antibiotics:  Oral vancomycin  Time spent: 20 minutes  Brandley Aldrete, [facility]  [facility] Hospitalists

## 2015-01-26 NOTE — Progress Notes (Signed)
Patient ID: Jeremy CorwinHarold W Phillis, male   DOB: [birth year], 49 y.o.   MRN: [id number] [facility]  [facility]: [facility]  HPI/Subjective:  Diarrhea has resolved but continues to complain of abdominal pain results of the sigmoidoscopy noted  Objective: Filed Vitals:   01/26/15 0528  BP: 107/63  Pulse: 54  Temp: 98.1 F (36.7 C)  Resp: 20    Filed Weights   01/18/15 1618 01/18/15 2345  Weight: 86.183 kg (190 lb) 86.728 kg (191 lb 3.2 oz)    ROS: Review of Systems  Constitutional: Negative for fever and chills.  Eyes: Negative for blurred vision.  Respiratory: Negative for cough and shortness of breath.   Cardiovascular: Negative for chest pain.  Gastrointestinal: Positive for nausea, abdominal pain and no diarrhea. Negative for vomiting and constipation.  Genitourinary: Negative for dysuria.  Musculoskeletal: Negative for joint pain.  Neurological: Negative for dizziness and headaches.   Exam: Physical Exam  Constitutional: He is oriented to person, place, and time.  HENT:  Nose: No mucosal edema.  Mouth/Throat: No oropharyngeal exudate or posterior oropharyngeal edema.  Eyes: Conjunctivae, EOM and lids are normal. Pupils are equal, round, and reactive to light.  Neck: No JVD present. Carotid bruit is not present. No edema present. No thyroid mass and no thyromegaly present.  Cardiovascular: S1 normal and S2 normal.  Exam reveals no gallop.   No murmur heard. Pulses:      Dorsalis pedis pulses are 2+ on the right side, and 2+ on the left side.  Respiratory: No respiratory distress. He has no wheezes. He has no rhonchi. He has no rales.  GI: Soft. Bowel sounds are normal. There is tenderness in the left upper quadrant and left lower quadrant.  Musculoskeletal:       Right ankle: He exhibits no swelling.       Left ankle: He exhibits no swelling.  Lymphadenopathy:    He has no cervical adenopathy.  Neurological: He is alert and oriented  to person, place, and time. No cranial nerve deficit.  Skin: Skin is warm. No rash noted. Nails show no clubbing.  Psychiatric: He has a normal mood and affect.    Data Reviewed: Basic Metabolic Panel:  Recent Labs Lab 01/21/15 0639 01/25/15 0425 01/25/15 1237  NA 139  --  139  K 3.7  --  3.8  CL 108  --  105  CO2 28  --  29  GLUCOSE 102*  --  94  BUN 7  --  8  CREATININE 0.85 0.85 0.88  CALCIUM 8.3*  --  8.8*  MG 1.9  --   --    Liver Function Tests: No results for input(s): AST, ALT, ALKPHOS, BILITOT, PROT, ALBUMIN in the last 168 hours. No results for input(s): LIPASE, AMYLASE in the last 168 hours. CBC:  Recent Labs Lab 01/25/15 1302  WBC 6.5  NEUTROABS 4.1  HGB 14.7  HCT 41.6  MCV 90.9  PLT 218     Recent Results (from the past 240 hour(s))  C difficile quick scan w PCR reflex     Status: Abnormal   Collection Time: 01/18/15  6:42 PM  Result Value Ref Range Status   C Diff antigen POSITIVE (A) NEGATIVE Final   C Diff toxin POSITIVE (A) NEGATIVE Final   C Diff interpretation   Final    Positive for toxigenic C. difficile, active toxin production present.    Comment: CRITICAL RESULT CALLED TO, READ BACK BY AND  VERIFIED WITHPaulene Floor AT 2046 01/18/15 SDR   Giardia, EIA; Ova/Parasite     Status: None   Collection Time: 01/19/15  3:05 AM  Result Value Ref Range Status   Ova + Parasite Exam Final report  Final    Comment: (NOTE) These results were obtained using wet preparation(s) and trichrome stained smear. This test does not include testing for Cryptosporidium parvum, Cyclospora, or Microsporidia.    Giardia Ag, Stl Negative Negative Final    Comment: (NOTE) Performed At: [name] [address] [place], [place] [id number] Revonda Standard MD [id number] Performed At: [facility] [address] [place], [place] [id number] Mila Homer MD [id number]   Stool culture     Status: None   Collection Time: 01/19/15   3:05 AM  Result Value Ref Range Status   Specimen Description STOOL  Final   Special Requests NONE  Final   Culture   Final    NO SALMONELLA OR SHIGELLA ISOLATED NO CAMPYLOBACTER DETECTED No Pathogenic E. coli detected    Report Status 01/21/2015 FINAL  Final     Scheduled Meds: . enoxaparin (LOVENOX) injection  40 mg Subcutaneous Q24H  . famotidine  20 mg Oral BID  . vancomycin  250 mg Oral 4 times per day   Continuous Infusions:    Assessment/Plan:  1. Recurrent Clostridium difficile colitis, abdominal pain continue vancomycin, sigmoidoscopy with inflammation elevated pathology 2. Gastroesophageal reflux disease without esophagitis- switched PPI to famotidine  Code Status:     Code Status Orders        Start     Ordered   01/18/15 2341  Full code   Continuous     01/18/15 2340     Family Communication: Wife yesterday Disposition Plan: Home once pain and diarrhea slows down.  Consultants:  Gastrointestinal  Antibiotics:  Oral vancomycin  Time spent: 20 minutes  Jaylanni Eltringham, [facility]  [facility] Hospital[facility]

## 2015-01-26 NOTE — Progress Notes (Signed)
Nutrition Follow-up   INTERVENTION:   Meals and Snacks: Cater to patient preferences Coordination of Care: Will sign off at this time secondary to pt eating 95-100% of meals, diarrhea improving, on regular diet and with food being brought in. Please re-consult RD if change in nutrition status.   NUTRITION DIAGNOSIS:   No nutrition diagnosis at this time.  GOAL:   Patient will meet greater than or equal to 90% of their needs  MONITOR:    (Energy Intake, Digestive system, Anthropometrics)  REASON FOR ASSESSMENT:   Diagnosis    ASSESSMENT:   Pt s/p flex sig yesterday, per MD note did not have fecal stool implant. Pt with improving diarrhea per MD note, c.diff slowly resolving. Pt remains on isolation.   Diet Order:  Diet regular Room service appropriate?: Yes; Fluid consistency:: Thin    Current Nutrition: Pt ate 95% of breakfast this am of sandwich and grits. Reports eating 100% of dinner last night from [employer] brought in by family. Pt reports appetite is good has eaten well.    Gastrointestinal Profile: Last BM:  loose stools, 01/26/2015   Scheduled Medications:  . enoxaparin (LOVENOX) injection  40 mg Subcutaneous Q24H  . famotidine  20 mg Oral BID  . vancomycin  250 mg Oral 4 times per day     Electrolyte/Renal Profile and Glucose Profile:   Recent Labs Lab 01/21/15 0639 01/25/15 0425 01/25/15 1237  NA 139  --  139  K 3.7  --  3.8  CL 108  --  105  CO2 28  --  29  BUN 7  --  8  CREATININE 0.85 0.85 0.88  CALCIUM 8.3*  --  8.8*  MG 1.9  --   --   GLUCOSE 102*  --  94   Protein Profile: No results for input(s): ALBUMIN in the last 168 hours.   Weight Trend since Admission: Filed Weights   01/18/15 1618 01/18/15 2345  Weight: 190 lb (86.183 kg) 191 lb 3.2 oz (86.728 kg)   Skin:  Reviewed, no issues   BMI:  Body mass index is 29.94 kg/(m^2).   Leda QuailAllyson Shequita Peplinski, [place]RD, LDN Pager [phone]

## 2015-01-26 NOTE — Progress Notes (Signed)
Awake alert this am. Pt reports feeling better this am. Cont to have some abd pain lower abd area 4/10. Had flex sig on 10-24 and tol well. See notes for results. tol diet well.

## 2015-01-26 NOTE — Consult Note (Signed)
Subjective: Patient seen for C. difficile colitis. She continues to have some lower abdominal discomfort perhaps a 2 out of 10 and some complaint of discomfort at the inguinal rings. He does have a history of a left inguinal hernia which she needs to reduce from time to time. He is apparently not seen a Careers advisersurgeon in that regard. He has had no bowel movement today. He is tolerating a regular diet. No nausea or emesis.  Objective: Vital signs in last 24 hours: Temp:  [97.2 F (36.2 C)-98.1 F (36.7 C)] 98.1 F (36.7 C) (10/25 1415) Pulse Rate:  [50-75] 75 (10/25 1415) Resp:  [12-20] 20 (10/25 0528) BP: (107-147)/(63-86) 127/73 mmHg (10/25 1415) SpO2:  [95 %-99 %] 98 % (10/25 1415) Blood pressure 127/73, pulse 75, temperature 98.1 F (36.7 C), temperature source Oral, resp. rate 20, height 5\' 7"  (1.702 m), weight 86.728 kg (191 lb 3.2 oz), SpO2 98 %.   Intake/Output from previous day:    Intake/Output this shift:     General appearance:  49 year old male no distress Resp:  Bilaterally clear to auscultation Cardio:  Regular rate and rhythm without rub or gallop GI:  Soft mild discomfort to palpation across the lower abdomen mostly left lower. He is very tender at the level of the inguinal ring on the left less so on the right Extremities:  No clubbing cyanosis or edema   Lab Results: No results found for this or any previous visit (from the past 24 hour(s)).    Recent Labs  01/25/15 1302  WBC 6.5  HGB 14.7  HCT 41.6  PLT 218   BMET  Recent Labs  01/25/15 0425 01/25/15 1237  NA  --  139  K  --  3.8  CL  --  105  CO2  --  29  GLUCOSE  --  94  BUN  --  8  CREATININE 0.85 0.88  CALCIUM  --  8.8*   LFT No results for input(s): PROT, ALBUMIN, AST, ALT, ALKPHOS, BILITOT, BILIDIR, IBILI in the last 72 hours. PT/INR  Recent Labs  01/25/15 1302  LABPROT 13.2  INR 0.98   Hepatitis Panel No results for input(s): HEPBSAG, HCVAB, HEPAIGM, HEPBIGM in the last 72  hours. C-Diff No results for input(s): CDIFFTOX in the last 72 hours. No results for input(s): CDIFFPCR in the last 72 hours.   Studies/Results: No results found.  Scheduled Inpatient Medications:   . enoxaparin (LOVENOX) injection  40 mg Subcutaneous Q24H  . famotidine  20 mg Oral BID  . vancomycin  250 mg Oral 4 times per day    Continuous Inpatient Infusions:     PRN Inpatient Medications:  acetaminophen **OR** acetaminophen, diphenhydrAMINE, morphine injection, ondansetron **OR** ondansetron (ZOFRAN) IV, oxyCODONE-acetaminophen  Miscellaneous:   Assessment:  1. Clostridium difficile colitis. Much improvement from admission. Today he is apparently the first day in a number of months that he has not had a loose bowel movement. Abdominal pain continues but is much improved.  Plan:  1. Would continue to a 14 day course of vancomycin 4 times daily. Then taper it 1 dose every 3 days until off of the medication.(3 times a day for 3 days, then twice a day for 3 days, then once a day for 3 days then discontinue) 2. Will start florastor 500 mg twice a day by mouth. 3. Awaiting return of biopsies from flexible sigmoidoscopy yesterday. If he is otherwise doing well he can be followed up outpatient in this regard. He will  need a outpatient follow-up with GI in about 2-3 weeks. I have cautioned he and his wife that should he have recurrent diarrhea he needs to get in touch have stool studies done to re-check for the C. difficile.  Christena Deem MD 01/26/2015, 6:11 PM

## 2015-01-26 NOTE — Plan of Care (Signed)
Problem: Discharge Progression Outcomes Goal: Other Discharge Outcomes/Goals Outcome: Progressing 1. C/o abdominal pain relieved by PRN Oxycodone.   2. Hemodynamically: VSS, afebrile, IVF infusing as ordered, PO Vanc Q6H, no loose stools this shift so far 3. Tolerating regular diet without problem   4. Ambulates safely in room independently. Understands how to call for assistance if needed.

## 2015-01-26 NOTE — Progress Notes (Signed)
No bms today. Cont to have lower abd pain  Prn x1  For c/o pain with some relief.

## 2015-01-27 MED ORDER — VANCOMYCIN HCL 250 MG PO CAPS: 250.0000 mg | ORAL_CAPSULE | Freq: Four times a day (QID) | ORAL | Status: DC

## 2015-01-27 MED ORDER — VANCOMYCIN 50 MG/ML ORAL SOLUTION: 250.0000 mg | Freq: Four times a day (QID) | ORAL | Status: DC

## 2015-01-27 MED ORDER — OXYCODONE-ACETAMINOPHEN 5-325 MG PO TABS: 1.0000 | ORAL_TABLET | ORAL | Status: DC | PRN

## 2015-01-27 MED ORDER — ACETAMINOPHEN 325 MG PO TABS: 650.0000 mg | ORAL_TABLET | Freq: Four times a day (QID) | ORAL | Status: DC | PRN

## 2015-01-27 MED ORDER — FAMOTIDINE 20 MG PO TABS: 20.0000 mg | ORAL_TABLET | Freq: Two times a day (BID) | ORAL | Status: DC

## 2015-01-27 NOTE — Progress Notes (Signed)
[facility] [place] [facility]        Jeremy Spence was admitted to the Hospital on 01/18/2015 and Discharged  01/27/2015 and should be excused from work/school   for 15 days starting 01/18/2015 , may return to work/school with no heavy lifting for 3 wks.   without any restric Call Jeremy BilberryShreyang Chrisha Vogel MD with questions.  Jeremy BilberryPATEL, Kasim Mccorkle M.D on 01/27/2015,at 8:41 AM  [facility] [place] [facility]    Office  [phone]

## 2015-01-27 NOTE — Discharge Summary (Signed)
Jeremy Spence [place], 49 y.o., DOB 1965/08/27, MRN [id number]. Admission date: 01/18/2015 Discharge Date 01/27/2015 Primary MD [facility] Admitting Physician Oralia Manis, MD  Admission Diagnosis  C. difficile colitis [A04.7]  Discharge Diagnosis   Principal Problem:   Recurrent colitis due to Clostridium difficile   GERD (gastroesophageal reflux disease)   Clostridium difficile colitis   Abdominal pain        Hospital Course Jeremy Spence is a 49 y.o. male who presents with recurrent C dif colitis. Patient stated that he has had persistent diarrhea and GI symptoms for the last 5 months. He was seen in our ED couple of weeks ago and was found have a colitis. C. difficile was not checked at that time, he was sent home on Cipro and Flagyl. He states that his symptoms improved somewhat, specifically his vomiting and his abdominal pain while on these antibiotics. However, once he finished the course his abdominal pain began again. He states that the diarrhea never really changed. Doesn't have insurance. He has an appointment through charity care with [facility] to see a gastroenterologist in December. Patient presented with similar complaints to her emergency room ED C. difficile was checked and was positive. Patient was admitted for him C. difficile colitis. He was started on oral vancomycin therapy. He was very slow to improve. He was seen by GI and due to his diarrhea slowed improve those a plan to do a stool implantation. However he had a sigmoidoscopy which showed inflammation. He was continued on treatment with resolution of his diarrhea. Per GI he's been placed on a longer dose of vancomycin he'll need outpatient follow-up for further evaluation and recommendations. Also follow up on the pathology from his sigmoidoscopy           Consults  GI  Significant Tests:  See full reports for all details    Ct Abdomen Pelvis W Contrast  01/18/2015  CLINICAL DATA:  LEFT lower  quadrant pain. Recent diagnosis of enteritis. EXAM: CT ABDOMEN AND PELVIS WITH CONTRAST TECHNIQUE: Multidetector CT imaging of the abdomen and pelvis was performed using the standard protocol following bolus administration of intravenous contrast. CONTRAST:  OMNIPAQUE IOHEXOL 300 MG/ML  SOLN COMPARISON:  CT 01/05/2015 FINDINGS: Lower chest: Lung bases are clear. Hepatobiliary: No focal hepatic lesion. No biliary duct dilatation. Gallbladder is normal. Common bile duct is normal. Pancreas: Pancreas is normal. No ductal dilatation. No pancreatic inflammation. Spleen: Normal spleen Adrenals/urinary tract: Adrenal glands and kidneys are normal. The ureters and bladder normal. Stomach/Bowel: The stomach and duodenum are normal. There is interval resolution of the diffuse small bowel dilatation and bowel inflammation seen on comparison exam. There is normal mucosal pattern of the jejunum and ileum. Contrast flows into the ascending colon. Appendix not identified. Beginning in the descending colon there is a long segment of mild bowel wall thickening and mild pericolonic inflammation. This extends into the proximal sigmoid colon (images 45 through 73 of series 2). This LEFT colon inflammation is also seen on coronal image 73 and 55. There is mild bowel wall thickening in the rectum. Vascular/Lymphatic: Abdominal aorta is normal caliber. There is no retroperitoneal or periportal lymphadenopathy. No pelvic lymphadenopathy. Reproductive: Normal prostate Musculoskeletal: SI joints appear normal. No aggressive osseous lesion. Other: Bilateral fat filled inguinal hernias. IMPRESSION: Long segment of descending colon and proximal sigmoid colon inflammation consistent with segmental colitis. Differential would include inflammatory bowel disease, infectious colitis, drug induced colitis, and less likely ischemic colitis. Resolution of enteritis seen on comparison  exam. Electronically Signed   By: Genevive Bi M.D.   On:  01/18/2015 20:15   Ct Abdomen Pelvis W Contrast  01/05/2015  CLINICAL DATA:  Generalized abdominal pain, nausea, vomiting for 2 days. EXAM: CT ABDOMEN AND PELVIS WITH CONTRAST TECHNIQUE: Multidetector CT imaging of the abdomen and pelvis was performed using the standard protocol following bolus administration of intravenous contrast. CONTRAST:  OMNIPAQUE IOHEXOL 300 MG/ML  SOLN COMPARISON:  None. FINDINGS: Minimal dependent atelectasis in both lung bases. No effusions. Heart is normal size. Liver, gallbladder, spleen, pancreas, adrenals and kidneys are normal. There are multiple small bowel loops in the right abdomen with thick walls. Mild surrounding inflammatory change in the mesenteric. Findings compatible with infectious or inflammatory enteritis. Mildly prominent small bowel loops likely related to ileus rather than functional obstruction. The terminal ileum is normal. Stomach and large bowel unremarkable. No free fluid, free air or adenopathy. Urinary bladder is unremarkable. Aorta is normal caliber. Small bilateral inguinal hernias containing fat. No acute bony abnormality.  Degenerative disc disease at L5-S1. IMPRESSION: Thick walled small bowel loops in the right abdomen with mild surrounding inflammatory change. Findings most compatible with infectious or inflammatory enteritis. Electronically Signed   By: Charlett Nose M.D.   On: 01/05/2015 10:43       Today   Subjective:   Jeremy Spence  states that his diarrhea is resolved does have some abdominal discomfort which is chronic  Objective:   Blood pressure 130/86, pulse 59, temperature 97.5 F (36.4 C), temperature source Oral, resp. rate 18, height 5\' 7"  (1.702 m), weight 86.728 kg (191 lb 3.2 oz), SpO2 97 %.  . No intake or output data in the 24 hours ending 01/27/15 1131  Exam VITAL SIGNS: Blood pressure 130/86, pulse 59, temperature 97.5 F (36.4 C), temperature source Oral, resp. rate 18, height 5\' 7"  (1.702 m), weight  86.728 kg (191 lb 3.2 oz), SpO2 97 %.  GENERAL:  49 y.o.-year-old patient lying in the bed with no acute distress.  EYES: Pupils equal, round, reactive to light and accommodation. No scleral icterus. Extraocular muscles intact.  HEENT: Head atraumatic, normocephalic. Oropharynx and nasopharynx clear.  NECK:  Supple, no jugular venous distention. No thyroid enlargement, no tenderness.  LUNGS: Normal breath sounds bilaterally, no wheezing, rales,rhonchi or crepitation. No use of accessory muscles of respiration.  CARDIOVASCULAR: S1, S2 normal. No murmurs, rubs, or gallops.  ABDOMEN: Soft, nontender, nondistended. Bowel sounds present. No organomegaly or mass.  EXTREMITIES: No pedal edema, cyanosis, or clubbing.  NEUROLOGIC: Cranial nerves II through XII are intact. Muscle strength 5/5 in all extremities. Sensation intact. Gait not checked.  PSYCHIATRIC: The patient is alert and oriented x 3.  SKIN: No obvious rash, lesion, or ulcer.   Data Review     CBC w Diff:  Lab Results  Component Value Date   WBC 6.5 01/25/2015   WBC 5.5 11/21/2012   HGB 14.7 01/25/2015   HGB 14.5 11/21/2012   HCT 41.6 01/25/2015   HCT 41.3 11/21/2012   PLT 218 01/25/2015   PLT 242 11/21/2012   LYMPHOPCT 21 01/25/2015   MONOPCT 9 01/25/2015   EOSPCT 6 01/25/2015   BASOPCT 1 01/25/2015   CMP:  Lab Results  Component Value Date   NA 139 01/25/2015   NA 138 11/21/2012   K 3.8 01/25/2015   K 3.7 11/21/2012   CL 105 01/25/2015   CL 104 11/21/2012   CO2 29 01/25/2015   CO2 29 11/21/2012  BUN 8 01/25/2015   BUN 12 11/21/2012   CREATININE 0.88 01/25/2015   CREATININE 1.01 11/21/2012   PROT 7.2 01/18/2015   PROT 7.3 11/21/2012   ALBUMIN 4.3 01/18/2015   ALBUMIN 4.0 11/21/2012   BILITOT 1.1 01/18/2015   BILITOT 0.9 11/21/2012   ALKPHOS 68 01/18/2015   ALKPHOS 78 11/21/2012   AST 22 01/18/2015   AST 19 11/21/2012   ALT 33 01/18/2015   ALT 22 11/21/2012  .  Micro Results Recent Results (from  the past 240 hour(s))  C difficile quick scan w PCR reflex     Status: Abnormal   Collection Time: 01/18/15  6:42 PM  Result Value Ref Range Status   C Diff antigen POSITIVE (A) NEGATIVE Final   C Diff toxin POSITIVE (A) NEGATIVE Final   C Diff interpretation   Final    Positive for toxigenic C. difficile, active toxin production present.    Comment: CRITICAL RESULT CALLED TO, READ BACK BY AND VERIFIED WITH:  Paulene FloorHENRY RIVERA AT 2046 01/18/15 SDR   Giardia, EIA; Ova/Parasite     Status: None   Collection Time: 01/19/15  3:05 AM  Result Value Ref Range Status   Ova + Parasite Exam Final report  Final    Comment: (NOTE) These results were obtained using wet preparation(s) and trichrome stained smear. This test does not include testing for Cryptosporidium parvum, Cyclospora, or Microsporidia.    Giardia Ag, Stl Negative Negative Final    Comment: (NOTE) Performed At: [facility] [place] [id number] [place], [place] [id number] Revonda Standardiley Celeste R MD QQ:[id number]:[phone] Performed At: [facility] [address] [place], [place] [id number] Mila HomerHancock William F MD ZY:[id number]:[phone]   Stool culture     Status: None   Collection Time: 01/19/15  3:05 AM  Result Value Ref Range Status   Specimen Description STOOL  Final   Special Requests NONE  Final   Culture   Final    NO SALMONELLA OR SHIGELLA ISOLATED NO CAMPYLOBACTER DETECTED No Pathogenic E. coli detected    Report Status 01/21/2015 FINAL  Final        Code Status Orders        Start     Ordered   01/18/15 2341  Full code   Continuous     01/18/15 2340          Follow-up Information    Follow [facility] with [facility] On 02/03/2015.   Specialty:  General Practice   Why:  at 11:00 am with Sandrea HughsJessica Rubio FNP   Contact information:   [address] [place] [address] [phone]       Follow up with Lynnae PrudeELLIOTT, ROBERT, MD On 02/16/2015.   Specialty:  Gastroenterology   Why:  at 2:00    Contact  information:   [address] [facility] [place] [address] [phone]       Discharge Medications     Medication List    STOP taking these medications        ondansetron 4 MG tablet  Commonly known as:  ZOFRAN      TAKE these medications        acetaminophen 325 MG tablet  Commonly known as:  TYLENOL  Take 2 tablets (650 mg total) by mouth every 6 (six) hours as needed for mild pain (or Fever >/= 101).     famotidine 20 MG tablet  Commonly known as:  PEPCID  Take 1 tablet (20  mg total) by mouth 2 (two) times daily.     oxyCODONE-acetaminophen 5-325 MG tablet  Commonly known as:  PERCOCET/ROXICET  Take 1 tablet by mouth every 4 (four) hours as needed for moderate pain or severe pain.     vancomycin 250 MG capsule  Commonly known as:  VANCOCIN HCL  Take 1 capsule (250 mg total) by mouth 4 (four) times daily.           Total Time in preparing paper work, data evaluation and todays exam - 35 minutes  Auburn Bilberry M.D on 01/27/2015 at 11:31 AM  [facility]   Office  [phone]

## 2015-01-27 NOTE — Discharge Instructions (Signed)
Antibiotic Medicine °Antibiotic medicines are used to treat infections caused by bacteria. They work by hurting or killing the germs that are making you sick. °HOW WILL MY MEDICINE BE PICKED? °There are many kinds of antibiotic medicines. To help your doctor pick one, tell your doctor if: °· You have any allergies. °· You are pregnant or plan to get pregnant. °· You are breastfeeding. °· You are taking any medicines. These include over-the-counter medicines, prescription medicines, and herbal remedies. °· You have a medical condition or problem. °If you have questions about why your medicine was picked, ask. °FOR HOW LONG SHOULD I TAKE MY MEDICINE? °Take your medicine for as long as your doctor tells you to. Do not stop taking it when you feel better. If you stop taking it too soon: °· You may start to feel sick again. °· Your infection may get harder to treat. °· New problems may develop. °WHAT IF I MISS A DOSE? °Try not to miss any doses of antibiotic medicine. If you miss a dose: °· Take the dose as soon as you can. °· If you are taking 2 doses a day, take the next dose in 5 to 6 hours. °· If you are taking 3 or more doses a day, take the next dose in 2 to 4 hours. Then go back to the normal schedule. °If you cannot take a missed dose, take the next dose on time. Then take the missed dose after you have taken all the doses as told by your doctor, as if you had one more dose left. °DOES THIS MEDICINE AFFECT BIRTH CONTROL? °Birth control pills may not work while you are on antibiotic medicines. If you are taking birth control pills, keep taking them as usual. Use a second form of birth control, such as a condom. Keep using the second form of birth control until you are finished with your current 1 month cycle of birth control pills. °GET HELP IF: °· You get worse. °· You do not feel better a few days after starting the medicine. °· You throw up (vomit). °· There are white patches in your mouth. °· You have new  joint pain after starting the medicine. °· You have new muscle aches after starting the medicine. °· You had a fever before starting the medicine, and it comes back. °· You have any symptoms of an allergic reaction, such as an itchy rash. If this happens, stop taking the medicine. °GET HELP RIGHT AWAY IF: °· Your pee (urine) turns dark or becomes blood-colored. °· Your skin turns yellow. °· You bruise or bleed easily. °· You have very bad watery poop (diarrhea) and cramps in your belly (abdomen). °· You have a very bad headache. °· You have signs of a very bad allergic reaction, such as: °¨ Trouble breathing. °¨ Wheezing. °¨ Swelling of the lips, tongue, or face. °¨ Fainting. °¨ Blisters on the skin or in the mouth. °If you have signs of a very bad allergic reaction, stop taking the antibiotic medicine right away. °  °This information is not intended to replace advice given to you by your health care provider. Make sure you discuss any questions you have with your health care provider. °  °Document Released: 12/28/2007 Document Revised: 12/09/2014 Document Reviewed: 08/05/2014 °Elsevier Interactive Patient Education ©2016 [employer] Inc. ° °

## 2015-01-27 NOTE — Plan of Care (Signed)
Problem: Discharge Progression Outcomes Goal: Other Discharge Outcomes/Goals Outcome: Progressing Plan of Care Progress to Goal:   Pt did not have a BM during shift. Pt report groin pain once during shift. No other signs of distress noted. Will continue to monitor.

## 2015-01-27 NOTE — Progress Notes (Addendum)
Pt discharged home per MD order. Discharge instructions given to patient. Prescriptions and work note given to patient. IV removed. All questions answered, patient verbalized understanding. Pt left with auxiliary and family member via wheelchair.

## 2015-01-28 ENCOUNTER — Encounter: Payer: Self-pay | Admitting: Gastroenterology

## 2015-01-28 LAB — SURGICAL PATHOLOGY

## 2015-02-18 ENCOUNTER — Other Ambulatory Visit
Admission: RE | Admit: 2015-02-18 | Discharge: 2015-02-18 | Disposition: A | Discharge status: Home / Self Care | Payer: Self-pay | Source: Ambulatory Visit | Attending: Nurse Practitioner | Admitting: Nurse Practitioner

## 2015-02-18 DIAGNOSIS — A047 Enterocolitis due to Clostridium difficile: Secondary | ICD-10-CM | POA: Insufficient documentation

## 2015-02-18 LAB — C DIFFICILE QUICK SCREEN W PCR REFLEX
C Diff antigen: POSITIVE — AB
C Diff toxin: NEGATIVE

## 2015-02-18 LAB — CLOSTRIDIUM DIFFICILE BY PCR: Toxigenic C. Difficile by PCR: POSITIVE — AB

## 2016-04-02 ENCOUNTER — Encounter: Payer: Self-pay | Admitting: Emergency Medicine

## 2016-04-02 ENCOUNTER — Emergency Department
Admission: EM | Admit: 2016-04-02 | Discharge: 2016-04-02 | Disposition: A | Discharge status: Home / Self Care | Payer: Self-pay | Attending: Emergency Medicine | Admitting: Emergency Medicine

## 2016-04-02 ENCOUNTER — Emergency Department: Payer: Self-pay

## 2016-04-02 DIAGNOSIS — F17290 Nicotine dependence, other tobacco product, uncomplicated: Secondary | ICD-10-CM | POA: Insufficient documentation

## 2016-04-02 DIAGNOSIS — Z79899 Other long term (current) drug therapy: Secondary | ICD-10-CM | POA: Insufficient documentation

## 2016-04-02 DIAGNOSIS — J209 Acute bronchitis, unspecified: Secondary | ICD-10-CM | POA: Insufficient documentation

## 2016-04-02 MED ORDER — GUAIFENESIN-CODEINE 100-10 MG/5ML PO SYRP: 10.0000 mL | ORAL_SOLUTION | Freq: Three times a day (TID) | ORAL | 0 refills | Status: DC | PRN

## 2016-04-02 MED ORDER — PREDNISONE 10 MG PO TABS: 50.0000 mg | ORAL_TABLET | Freq: Every day | ORAL | 0 refills | Status: DC

## 2016-04-02 MED ORDER — AZITHROMYCIN 250 MG PO TABS
ORAL_TABLET | ORAL | 0 refills | Status: DC
Start: 2016-04-02 — End: 2017-11-07

## 2016-04-02 MED ORDER — HYDROCOD POLST-CPM POLST ER 10-8 MG/5ML PO SUER
5.0000 mL | Freq: Once | ORAL | Status: AC
  Administered 2016-04-02: 5 mL via ORAL
  Filled 2016-04-02: qty 5

## 2016-04-02 NOTE — ED Triage Notes (Signed)
Patient reports symptoms for approximately a week.  Reports head/nasal congestion and cough (occasional productive).

## 2016-04-02 NOTE — ED Provider Notes (Signed)
[facility] Emergency Department Provider Note  ____________________________________________  Time seen: Approximately 10:39 PM  I have reviewed the triage vital signs and the nursing notes.   HISTORY  Chief Complaint Cough and Nasal Congestion   HPI Jeremy Spence is a 50 y.o. male who presents to the emergency department for evaluation of cough 1 week. No known fever. No other symptoms such as sore throat, and runny nose, nausea, vomiting, or diarrhea. He has not taken any medications for his current symptoms.   Past Medical History:  Diagnosis Date  . GERD (gastroesophageal reflux disease)     Patient Active Problem List   Diagnosis Date Noted  . Clostridium difficile colitis 01/20/2015  . Recurrent colitis due to Clostridium difficile 01/18/2015  . GERD (gastroesophageal reflux disease) 01/18/2015    Past Surgical History:  Procedure Laterality Date  . FLEXIBLE SIGMOIDOSCOPY N/A 01/25/2015   Procedure: FLEXIBLE SIGMOIDOSCOPY;  Surgeon: Christena DeemMartin U Skulskie, MD;  [facility];  Service: Endoscopy;  Laterality: N/A;  . STOMACH SURGERY      Prior to Admission medications   Medication Sig Start Date End Date Taking? Authorizing Provider  acetaminophen (TYLENOL) 325 MG tablet Take 2 tablets (650 mg total) by mouth every 6 (six) hours as needed for mild pain (or Fever >/= 101). 01/27/15   Auburn BilberryShreyang Patel, MD  azithromycin (ZITHROMAX) 250 MG tablet 2 tablets today, then 1 tablet for the next 4 days. 04/02/16   Chinita Pesterari B Chastidy Ranker, FNP  famotidine (PEPCID) 20 MG tablet Take 1 tablet (20 mg total) by mouth 2 (two) times daily. 01/27/15   Auburn BilberryShreyang Patel, MD  guaiFENesin-codeine (ROBITUSSIN AC) 100-10 MG/5ML syrup Take 10 mLs by mouth 3 (three) times daily as needed for cough. 04/02/16   Chinita Pesterari B Mauricio Dahlen, FNP  oxyCODONE-acetaminophen (PERCOCET/ROXICET) 5-325 MG tablet Take 1 tablet by mouth every 4 (four) hours as needed for moderate pain or  severe pain. 01/27/15   Auburn BilberryShreyang Patel, MD  predniSONE (DELTASONE) 10 MG tablet Take 5 tablets (50 mg total) by mouth daily. 04/02/16   Chinita Pesterari B Ansleigh Safer, FNP  vancomycin (VANCOCIN HCL) 250 MG capsule Take 1 capsule (250 mg total) by mouth 4 (four) times daily. 01/27/15   Auburn BilberryShreyang Patel, MD    Allergies Patient has no known allergies.  Family History  Problem Relation Age of Onset  . Heart disease Father   . Diabetes Father     Social History Social History  Substance Use Topics  . Smoking status: Never Smoker  . Smokeless tobacco: Current User    Types: Snuff  . Alcohol use No    Review of Systems Constitutional: Negative for fever/chills ENT: Negative for sore throat. Cardiovascular: Denies chest pain. Respiratory: Negative for shortness of breath. Positive for cough. Gastrointestinal: Negative for nausea,  no vomiting.  Negative for diarrhea.  Musculoskeletal: Negative for body aches Skin: Negative for rash. Neurological: Negative for headaches ____________________________________________   PHYSICAL EXAM:  VITAL SIGNS: ED Triage Vitals  Enc Vitals Group     BP 04/02/16 2224 126/78     Pulse Rate 04/02/16 2224 70     Resp 04/02/16 2224 20     Temp 04/02/16 2224 97.7 F (36.5 C)     Temp Source 04/02/16 2224 Oral     SpO2 04/02/16 2224 96 %     Weight 04/02/16 2226 200 lb (90.7 kg)     Height 04/02/16 2226 5\' 7"  (1.702 m)     Head Circumference --  Peak Flow --      Pain Score --      Pain Loc --      Pain Edu? --      Excl. in GC? --     Constitutional: Alert and oriented. Acutely ill appearing and in no acute distress. Eyes: Conjunctivae are normal. EOMI. Ears: Normal Nose: No congestion; no rhinnorhea. Mouth/Throat: Mucous membranes are moist.  Oropharynx mildly erythematous. Tonsils appear 1+ without exudate. Neck: No stridor.  Lymphatic: No cervical lymphadenopathy. Cardiovascular: Normal rate, regular rhythm. Grossly normal heart sounds.  Good  peripheral circulation. Respiratory: Normal respiratory effort.  No retractions. Breath sounds clear throughout. Frequent, dry cough noted. Gastrointestinal: Soft and nontender.  Musculoskeletal: FROM x 4 extremities.  Neurologic:  Normal speech and language.  Skin:  Skin is warm, dry and intact. No rash noted. Psychiatric: Mood and affect are normal. Speech and behavior are normal.  ____________________________________________   LABS (all labs ordered are listed, but only abnormal results are displayed)  Labs Reviewed - No data to display ____________________________________________  EKG   ____________________________________________  RADIOLOGY  Chest x-ray negative for acute cardiopulmonary abnormality per radiology. ____________________________________________   PROCEDURES  Procedure(s) performed: None  Critical Care performed: No  ____________________________________________   INITIAL IMPRESSION / ASSESSMENT AND PLAN / ED COURSE  Clinical Course     Pertinent labs & imaging results that were available during my care of the patient were reviewed by me and considered in my medical decision making (see chart for details).   50 year old male presenting to the emergency department for evaluation of cough 1 week. 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 Cough has been persistent for the past week and worsening at night. He does smoke cigarettes. He'll be treated for acute bronchitis. He was given prescriptions for azithromycin, prednisone 5 day burst, and Robitussin-AC. He states that he does have an albuterol inhaler at home and will use it as prescribed. He is encouraged follow-up with primary care provider of his choice for symptoms that are not improving over the next few days. He was encouraged to return to the emergency department for symptoms that change or worsen if he is unable schedule an appointment. ____________________________________________   FINAL  CLINICAL IMPRESSION(S) / ED DIAGNOSES  Final diagnoses:  Acute bronchitis, unspecified organism    Note:  This document was prepared using Dragon voice recognition software and may include unintentional dictation errors.     Chinita PesterCari B Samik Balkcom, FNP 04/02/16 [id number]    Sharyn CreamerMark Quale, MD 04/03/16 [phone]

## 2016-04-02 NOTE — Discharge Instructions (Signed)
Follow up with your primary care provider for symptoms that are not improving over the week. Return to the emergency department for symptoms change or worsen if he is unable schedule an appointment.

## 2017-03-31 IMAGING — CT CT ABD-PELV W/ CM
1 of 2 series · 15 of 32 positions shown, 19 images · IV contrast (omnipaque)
Comparison: None.

CLINICAL DATA: Generalized abdominal pain, nausea, vomiting for 2
days.

EXAM:
CT ABDOMEN AND PELVIS WITH CONTRAST
TECHNIQUE: Multidetector CT imaging of the abdomen and pelvis was performed
using the standard protocol following bolus administration of
intravenous contrast.
CONTRAST:  100mL OMNIPAQUE IOHEXOL 300 MG/ML  SOLN

[Series 2: routine abd pel with · axial · 0.70mm/px · z∈[-687,-217]mm · 15 of 104 slices shown, 19 images]
[im 5/104  soft-tissue]
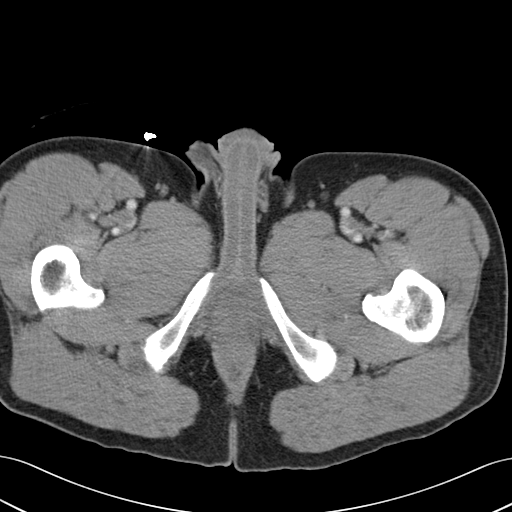
[im 5/104  bone]
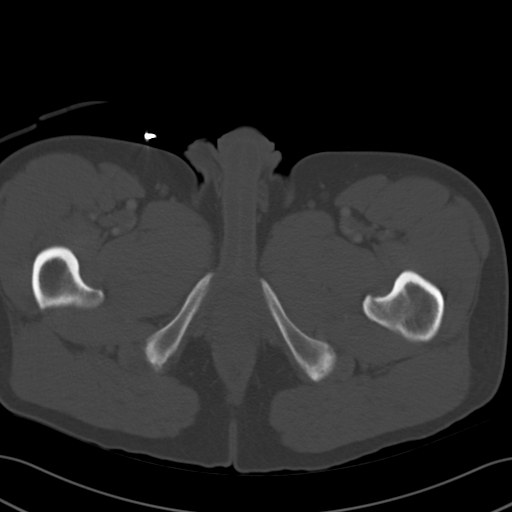
[im 13/104  soft-tissue]
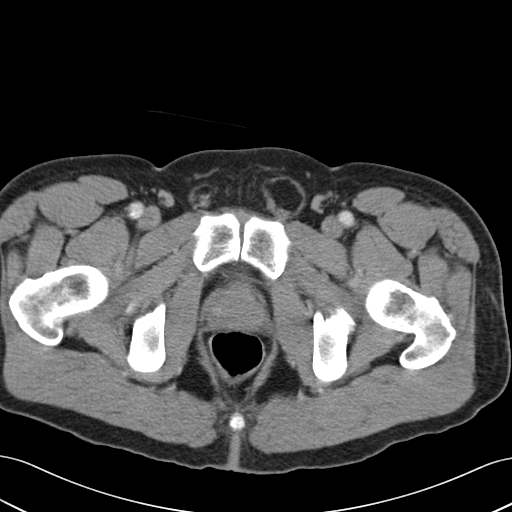
[im 22/104  soft-tissue]
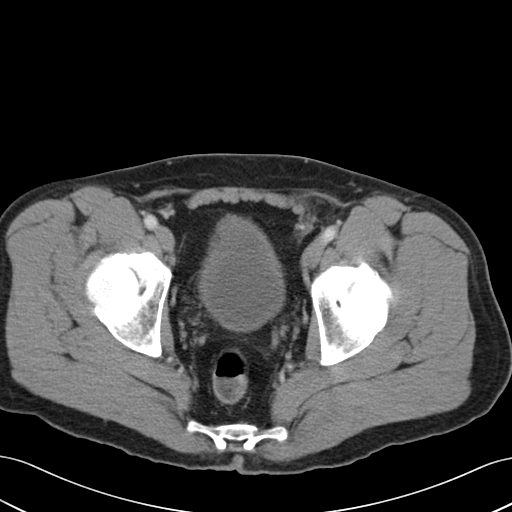
[im 31/104  soft-tissue]
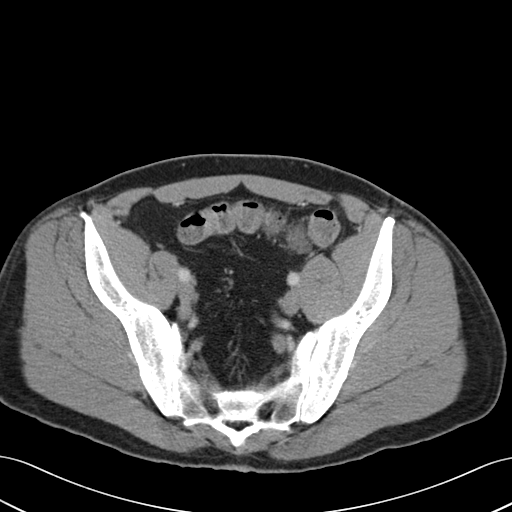
[im 35/104  soft-tissue]
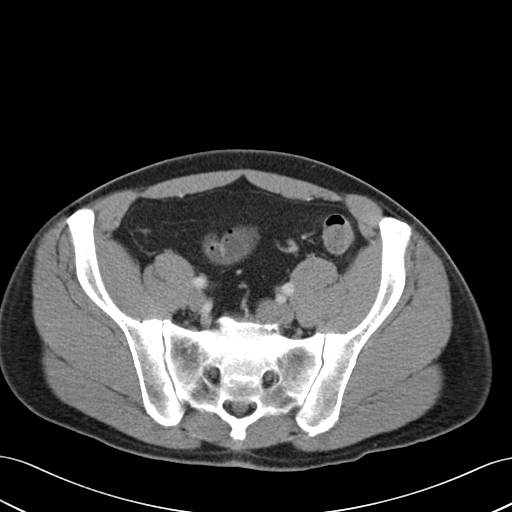
[im 43/104  soft-tissue]
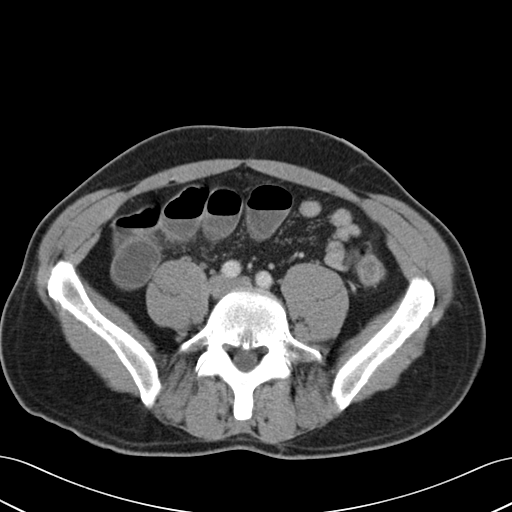
[im 52/104  soft-tissue]
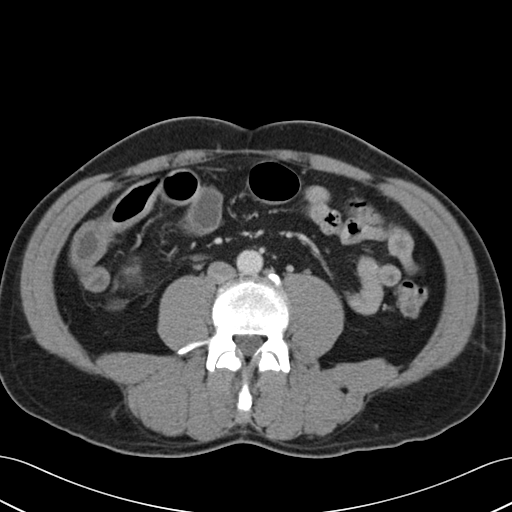
[im 61/104  soft-tissue]
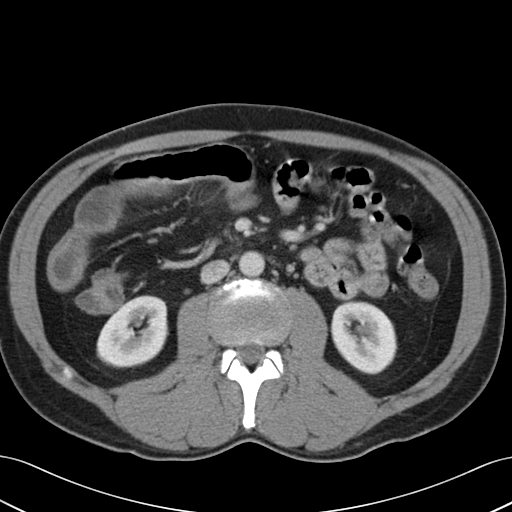
[im 69/104  soft-tissue]
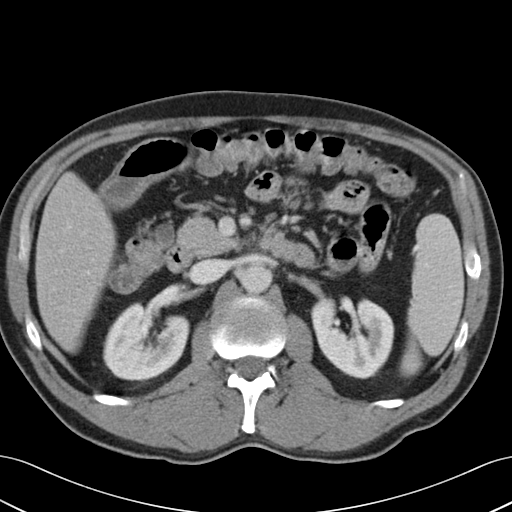
[im 69/104  bone]
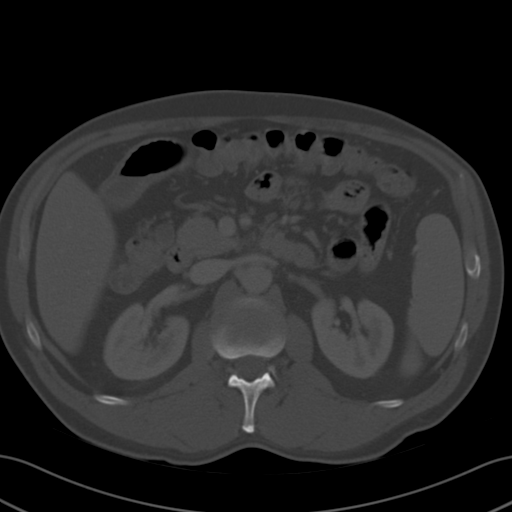
[im 73/104  soft-tissue]
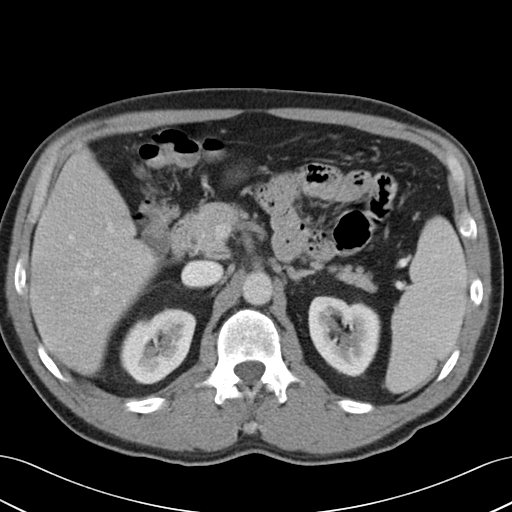
[im 82/104  soft-tissue]
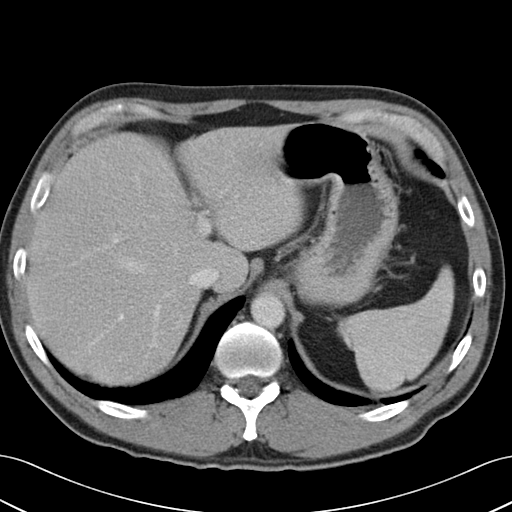
[im 86/104  lung]
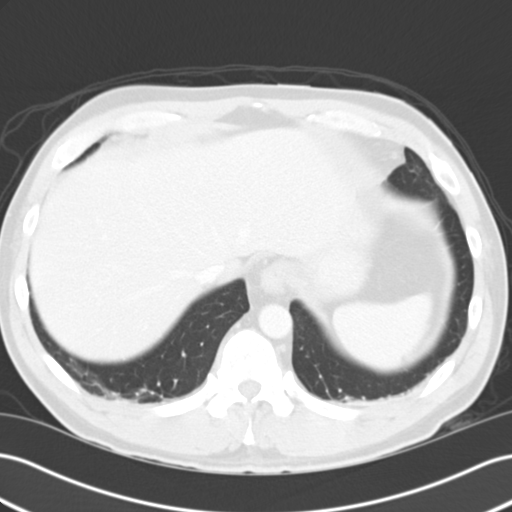
[im 91/104  soft-tissue]
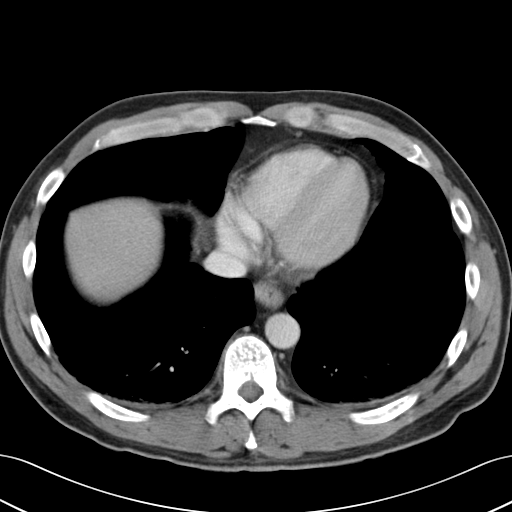
[im 91/104  lung]
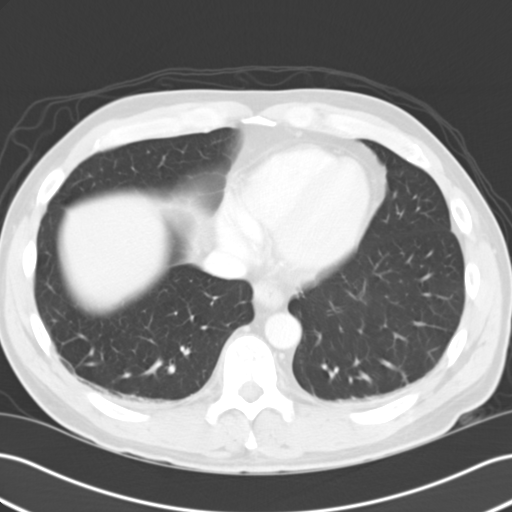
[im 95/104  lung]
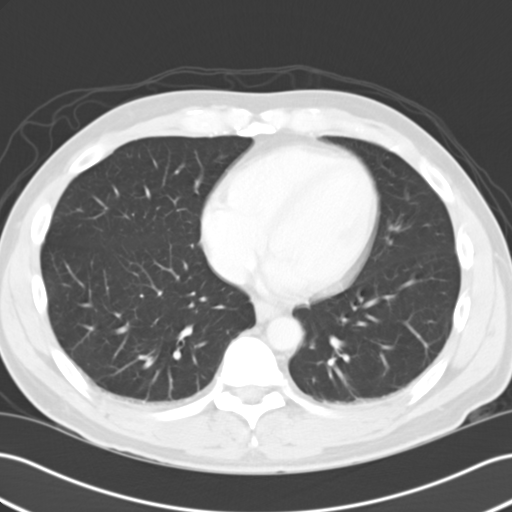
[im 99/104  soft-tissue]
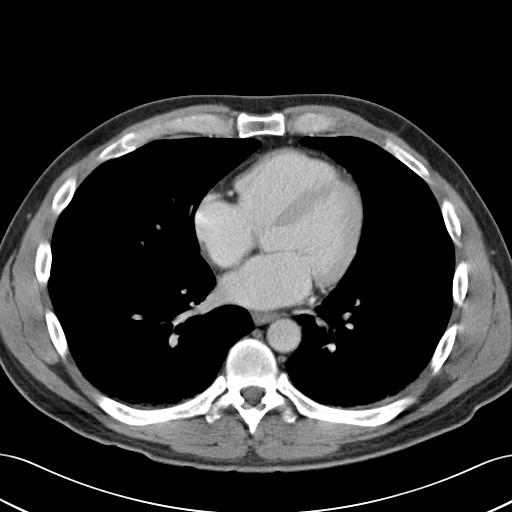
[im 99/104  lung]
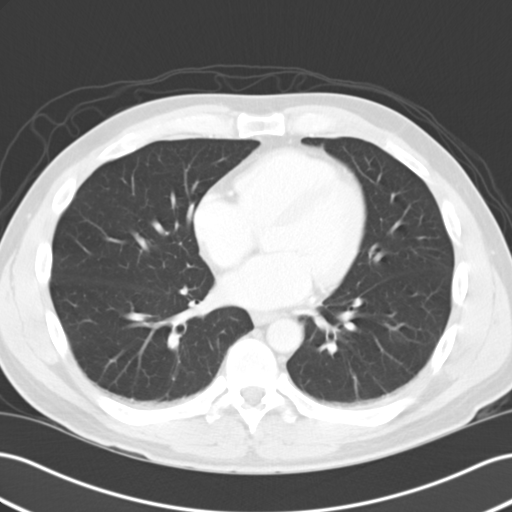

[15 of 32 positions shown; findings below may reference images not displayed]

FINDINGS: Minimal dependent atelectasis in both lung bases. No effusions.
Heart is normal size.

Liver, gallbladder, spleen, pancreas, adrenals and kidneys are
normal.

There are multiple small bowel loops in the right abdomen with thick
walls. Mild surrounding inflammatory change in the mesenteric.
Findings compatible with infectious or inflammatory enteritis.
Mildly prominent small bowel loops likely related to ileus rather
than functional obstruction. The terminal ileum is normal. Stomach
and large bowel unremarkable. No free fluid, free air or adenopathy.

Urinary bladder is unremarkable. Aorta is normal caliber. Small
bilateral inguinal hernias containing fat.

No acute bony abnormality.  Degenerative disc disease at L5-S1.
IMPRESSION: Thick walled small bowel loops in the right abdomen with mild
surrounding inflammatory change. Findings most compatible with
infectious or inflammatory enteritis.

## 2017-04-04 ENCOUNTER — Emergency Department
Admission: EM | Admit: 2017-04-04 | Discharge: 2017-04-04 | Disposition: A | Discharge status: Home / Self Care | Payer: Self-pay | Attending: Emergency Medicine | Admitting: Emergency Medicine

## 2017-04-04 ENCOUNTER — Encounter: Payer: Self-pay | Admitting: Emergency Medicine

## 2017-04-04 ENCOUNTER — Emergency Department: Payer: Self-pay

## 2017-04-04 DIAGNOSIS — Y939 Activity, unspecified: Secondary | ICD-10-CM | POA: Insufficient documentation

## 2017-04-04 DIAGNOSIS — W312XXA Contact with powered woodworking and forming machines, initial encounter: Secondary | ICD-10-CM | POA: Insufficient documentation

## 2017-04-04 DIAGNOSIS — Z79899 Other long term (current) drug therapy: Secondary | ICD-10-CM | POA: Insufficient documentation

## 2017-04-04 DIAGNOSIS — S62639B Displaced fracture of distal phalanx of unspecified finger, initial encounter for open fracture: Secondary | ICD-10-CM | POA: Insufficient documentation

## 2017-04-04 DIAGNOSIS — F17220 Nicotine dependence, chewing tobacco, uncomplicated: Secondary | ICD-10-CM | POA: Insufficient documentation

## 2017-04-04 DIAGNOSIS — Y999 Unspecified external cause status: Secondary | ICD-10-CM | POA: Insufficient documentation

## 2017-04-04 DIAGNOSIS — Y929 Unspecified place or not applicable: Secondary | ICD-10-CM | POA: Insufficient documentation

## 2017-04-04 DIAGNOSIS — S61211A Laceration without foreign body of left index finger without damage to nail, initial encounter: Secondary | ICD-10-CM | POA: Insufficient documentation

## 2017-04-04 DIAGNOSIS — S61311A Laceration without foreign body of left index finger with damage to nail, initial encounter: Secondary | ICD-10-CM

## 2017-04-04 DIAGNOSIS — S6992XA Unspecified injury of left wrist, hand and finger(s), initial encounter: Secondary | ICD-10-CM

## 2017-04-04 MED ORDER — LIDOCAINE HCL (PF) 1 % IJ SOLN
INTRAMUSCULAR | Status: AC
  Filled 2017-04-04: qty 5

## 2017-04-04 MED ORDER — CEPHALEXIN 500 MG PO CAPS: 500.0000 mg | ORAL_CAPSULE | Freq: Four times a day (QID) | ORAL | 0 refills | Status: AC

## 2017-04-04 MED ORDER — CEPHALEXIN 500 MG PO CAPS
500.0000 mg | ORAL_CAPSULE | Freq: Once | ORAL | Status: AC
  Administered 2017-04-04: 500 mg via ORAL
  Filled 2017-04-04: qty 1

## 2017-04-04 MED ORDER — OXYCODONE-ACETAMINOPHEN 5-325 MG PO TABS
1.0000 | ORAL_TABLET | Freq: Once | ORAL | Status: AC
  Administered 2017-04-04: 1 via ORAL
  Filled 2017-04-04: qty 1

## 2017-04-04 MED ORDER — OXYCODONE-ACETAMINOPHEN 5-325 MG PO TABS: 1.0000 | ORAL_TABLET | Freq: Four times a day (QID) | ORAL | 0 refills | Status: AC | PRN

## 2017-04-04 MED ORDER — CEPHALEXIN 500 MG PO CAPS: 500.0000 mg | ORAL_CAPSULE | Freq: Four times a day (QID) | ORAL | 0 refills | Status: DC

## 2017-04-04 MED ORDER — LIDOCAINE HCL (PF) 1 % IJ SOLN
5.0000 mL | Freq: Once | INTRAMUSCULAR | Status: AC
  Administered 2017-04-04: 5 mL via INTRADERMAL
  Filled 2017-04-04: qty 5

## 2017-04-04 NOTE — ED Provider Notes (Signed)
[facility] Emergency Department Provider Note  ____________________________________________  Time seen: Approximately 9:06 PM  I have reviewed the triage vital signs and the nursing notes.   HISTORY  Chief Complaint Laceration    HPI Jeremy Spence is a 52 y.o. male that presents to the emergency department for evaluation of left finger laceration. Patient got finger caught in a wood splitter.  Last tetanus shot was 3-4 years ago.  No additional injuries or concerns.  Past Medical History:  Diagnosis Date  . GERD (gastroesophageal reflux disease)     Patient Active Problem List   Diagnosis Date Noted  . Clostridium difficile colitis 01/20/2015  . Recurrent colitis due to Clostridium difficile 01/18/2015  . GERD (gastroesophageal reflux disease) 01/18/2015    Past Surgical History:  Procedure Laterality Date  . FLEXIBLE SIGMOIDOSCOPY N/A 01/25/2015   Procedure: FLEXIBLE SIGMOIDOSCOPY;  Surgeon: Christena Deem, MD;  Location: [facility] ENDOSCOPY;  Service: Endoscopy;  Laterality: N/A;  . INGUINAL HERNIA REPAIR    . STOMACH SURGERY      Prior to Admission medications   Medication Sig Start Date End Date Taking? Authorizing Provider  acetaminophen (TYLENOL) 325 MG tablet Take 2 tablets (650 mg total) by mouth every 6 (six) hours as needed for mild pain (or Fever >/= 101). 01/27/15   Auburn Bilberry, MD  azithromycin (ZITHROMAX) 250 MG tablet 2 tablets today, then 1 tablet for the next 4 days. 04/02/16   Triplett, Rulon Eisenmenger B, FNP  cephALEXin (KEFLEX) 500 MG capsule Take 1 capsule (500 mg total) by mouth 4 (four) times daily for 10 days. 04/04/17 04/14/17  Enid Derry, PA-C  famotidine (PEPCID) 20 MG tablet Take 1 tablet (20 mg total) by mouth 2 (two) times daily. 01/27/15   Auburn Bilberry, MD  guaiFENesin-codeine [facility]) 100-10 MG/5ML syrup Take 10 mLs by mouth 3 (three) times daily as needed for cough. 04/02/16   Triplett, Rulon Eisenmenger B, FNP   oxyCODONE-acetaminophen (ROXICET) 5-325 MG tablet Take 1 tablet by mouth every 6 (six) hours as needed for up to 3 days. 04/04/17 04/07/17  Enid Derry, PA-C  predniSONE (DELTASONE) 10 MG tablet Take 5 tablets (50 mg total) by mouth daily. 04/02/16   Triplett, Rulon Eisenmenger B, FNP  vancomycin (VANCOCIN HCL) 250 MG capsule Take 1 capsule (250 mg total) by mouth 4 (four) times daily. 01/27/15   Auburn Bilberry, MD    Allergies Patient has no known allergies.  Family History  Problem Relation Age of Onset  . Heart disease Father   . Diabetes Father     Social History Social History   Tobacco Use  . Smoking status: Never Smoker  . Smokeless tobacco: Current User    Types: Snuff  Substance Use Topics  . Alcohol use: No  . Drug use: No     Review of Systems  Constitutional: No fever/chills Cardiovascular: No chest pain. Respiratory: No SOB. Gastrointestinal: No abdominal pain.  No nausea, no vomiting.  Musculoskeletal: Positive for finger pain. Skin: Negative for rash, ecchymosis.  Positive for laceration.   ____________________________________________   PHYSICAL EXAM:  VITAL SIGNS: ED Triage Vitals [04/04/17 1942]  Enc Vitals Group     BP (!) 145/80     Pulse Rate 61     Resp 16     Temp 97.9 F (36.6 C)     Temp Source Oral     SpO2 99 %     Weight 205 lb (93 kg)     Height 5\' 7"  (1.702  m)     Head Circumference      Peak Flow      Pain Score 5     Pain Loc      Pain Edu?      Excl. in GC?      Constitutional: Alert and oriented. Well appearing and in no acute distress. Eyes: Conjunctivae are normal. PERRL. EOMI. Head: Atraumatic. ENT:      Ears:      Nose: No congestion/rhinnorhea.      Mouth/Throat: Mucous membranes are moist.  Neck: No stridor.  Cardiovascular: Normal rate, regular rhythm.  Good peripheral circulation.  Palpable radial pulses bilaterally. Respiratory: Normal respiratory effort without tachypnea or retractions. Lungs CTAB. Good air entry  to the bases with no decreased or absent breath sounds. Musculoskeletal: Full range of motion to all extremities. No gross deformities appreciated. Neurologic:  Normal speech and language. No gross focal neurologic deficits are appreciated.  Skin:  Skin is warm, dry. 1 cm jagged laceration with missing tissue that wraps around distal left index finger. Nailbed disrupted. Subungual hematoma.   ____________________________________________   LABS (all labs ordered are listed, but only abnormal results are displayed)  Labs Reviewed - No data to display ____________________________________________  EKG   ____________________________________________  RADIOLOGY Lexine Baton, personally viewed and evaluated these images (plain radiographs) as part of my medical decision making, as well as reviewing the written report by the radiologist.  Dg Finger Index Left  Result Date: 04/04/2017 CLINICAL DATA:  Laceration to LEFT hand index finger, struck with a wood splitter, injury EXAM: LEFT INDEX FINGER 2+V COMPARISON:  None FINDINGS: Osseous mineralization normal. Joint spaces preserved. Minimally displaced tuft fracture at distal phalanx LEFT index finger. Associated soft tissue deformity/laceration. No additional fracture, dislocation, or bone destruction. IMPRESSION: Minimally displaced tuft fracture at distal phalanx LEFT index finger. Electronically Signed   By: Ulyses Southward M.D.   On: 04/04/2017 21:21    ____________________________________________    PROCEDURES  Procedure(s) performed:    Procedures  LACERATION REPAIR Performed by: Enid Derry  Consent: Verbal consent obtained.  Consent given by: patient  Prepped and Draped in normal sterile fashion  Wound explored: No foreign bodies   Laceration Location: left index finger  Laceration Length: 1 cm  Anesthesia: None  Local anesthetic: lidocaine 1% without epinephrine  Anesthetic total: 6 ml  Irrigation method:  syringe  Amount of cleaning: normal saline  Skin closure: 4-0 nylon  Number of sutures: 7  Technique: Simple interrupted  Patient tolerance: Patient tolerated the procedure well with no immediate complications.  Trephination performed with 18G needle to drain blood.   Medications  lidocaine (PF) (XYLOCAINE) 1 % injection 5 mL (5 mLs Intradermal Given 04/04/17 2114)  oxyCODONE-acetaminophen (PERCOCET/ROXICET) 5-325 MG per tablet 1 tablet (1 tablet Oral Given 04/04/17 2114)  cephALEXin (KEFLEX) capsule 500 mg (500 mg Oral Given 04/04/17 2252)    ____________________________________________   INITIAL IMPRESSION / ASSESSMENT AND PLAN / ED COURSE  Pertinent labs & imaging results that were available during my care of the patient were reviewed by me and considered in my medical decision making (see chart for details).  Review of the [place] CSRS was performed in accordance of the [facility] prior to dispensing any controlled drugs.   Patient's diagnosis is consistent with finger laceration and distal tuft fracture.  Vital signs and exam are reassuring.  X-ray consistent with fracture.  Laceration is jagged with missing tissue and was repaired with stitches.  Subungual hematoma  was drained.  Splint was placed.  Patient was given a dose of Keflex.  Tetanus shot is up-to-date.  Patient will be discharged home with prescriptions for Keflex. Patient is to follow up with hand specialist as directed. Patient is given ED precautions to return to the ED for any worsening or new symptoms.     ____________________________________________  FINAL CLINICAL IMPRESSION(S) / ED DIAGNOSES  Final diagnoses:  Laceration of left index finger without foreign body with damage to nail, initial encounter  Injury of finger of left hand, initial encounter  Open fracture of tuft of distal phalanx of finger      NEW MEDICATIONS STARTED DURING THIS VISIT:  ED Discharge Orders        Ordered    cephALEXin  (KEFLEX) 500 MG capsule  4 times daily,   Status:  Discontinued     04/04/17 2105    oxyCODONE-acetaminophen (ROXICET) 5-325 MG tablet  Every 6 hours PRN     04/04/17 2228    cephALEXin (KEFLEX) 500 MG capsule  4 times daily     04/04/17 2228          This chart was dictated using voice recognition software/Dragon. Despite best efforts to proofread, errors can occur which can change the meaning. Any change was purely unintentional.    Enid DerryWagner, Kash Davie, PA-C 04/04/17 2315    Minna AntisPaduchowski, Kevin, MD 04/04/17 [phone]

## 2017-04-04 NOTE — ED Notes (Signed)
Attempted to call Ray, supervisor for [employer], in order to complete workers comp claim.  Unable to get a hold of him at this time.

## 2017-04-04 NOTE — ED Notes (Signed)
Attempted to contact owner of company [employer] at [phone] and message left regarding required testing since profile is not in our system.

## 2017-04-04 NOTE — ED Triage Notes (Signed)
Pt comes into the ED via POV c/o laceration to the left hand index finger where he hit it with a wood splitter.  Patient has all bleeding under control at this time and is in NAd with even and unlabored respirations.  The laceration is horizontal over the first joint on the index finger.  Patient will be completing this as a workers comp.

## 2017-11-07 ENCOUNTER — Emergency Department: Payer: Self-pay

## 2017-11-07 ENCOUNTER — Other Ambulatory Visit: Payer: Self-pay

## 2017-11-07 ENCOUNTER — Emergency Department
Admission: EM | Admit: 2017-11-07 | Discharge: 2017-11-07 | Disposition: A | Discharge status: Home / Self Care | Payer: Self-pay | Attending: Emergency Medicine | Admitting: Emergency Medicine

## 2017-11-07 DIAGNOSIS — R0989 Other specified symptoms and signs involving the circulatory and respiratory systems: Secondary | ICD-10-CM | POA: Insufficient documentation

## 2017-11-07 DIAGNOSIS — J181 Lobar pneumonia, unspecified organism: Secondary | ICD-10-CM | POA: Insufficient documentation

## 2017-11-07 DIAGNOSIS — J189 Pneumonia, unspecified organism: Secondary | ICD-10-CM

## 2017-11-07 DIAGNOSIS — Z79899 Other long term (current) drug therapy: Secondary | ICD-10-CM | POA: Insufficient documentation

## 2017-11-07 DIAGNOSIS — F17228 Nicotine dependence, chewing tobacco, with other nicotine-induced disorders: Secondary | ICD-10-CM | POA: Insufficient documentation

## 2017-11-07 MED ORDER — PSEUDOEPH-BROMPHEN-DM 30-2-10 MG/5ML PO SYRP: 5.0000 mL | ORAL_SOLUTION | Freq: Four times a day (QID) | ORAL | 0 refills | Status: DC | PRN

## 2017-11-07 MED ORDER — AZITHROMYCIN 250 MG PO TABS: ORAL_TABLET | ORAL | 0 refills | Status: DC

## 2017-11-07 NOTE — ED Provider Notes (Signed)
[facility] Emergency Department Provider Note   ____________________________________________   None    (approximate)  I have reviewed the triage vital signs and the nursing notes.   HISTORY  Chief Complaint Cough    HPI Jeremy Spence is a 52 y.o. male patient complain of productive yellow sputum and chest congestion for 3 to 4 days.  Patient denies fever, vomiting, or diarrhea.  Patient state no relief taking DayQuil and NyQuil.  Patient rates her chest discomfort as a 3/10.  Past Medical History:  Diagnosis Date  . GERD (gastroesophageal reflux disease)     Patient Active Problem List   Diagnosis Date Noted  . Clostridium difficile colitis 01/20/2015  . Recurrent colitis due to Clostridium difficile 01/18/2015  . GERD (gastroesophageal reflux disease) 01/18/2015    Past Surgical History:  Procedure Laterality Date  . FLEXIBLE SIGMOIDOSCOPY N/A 01/25/2015   Procedure: FLEXIBLE SIGMOIDOSCOPY;  Surgeon: Christena Deem, MD;  Location: [facility] ENDOSCOPY;  Service: Endoscopy;  Laterality: N/A;  . INGUINAL HERNIA REPAIR    . STOMACH SURGERY      Prior to Admission medications   Medication Sig Start Date End Date Taking? Authorizing Provider  acetaminophen (TYLENOL) 325 MG tablet Take 2 tablets (650 mg total) by mouth every 6 (six) hours as needed for mild pain (or Fever >/= 101). 01/27/15   Auburn Bilberry, MD  azithromycin (ZITHROMAX) 250 MG tablet 2 tablets today, then 1 tablet for the next 4 days. 11/07/17   Joni Reining, PA-C  brompheniramine-pseudoephedrine-DM 30-2-10 MG/5ML syrup Take 5 mLs by mouth 4 (four) times daily as needed. 11/07/17   Joni Reining, PA-C  famotidine (PEPCID) 20 MG tablet Take 1 tablet (20 mg total) by mouth 2 (two) times daily. 01/27/15   Auburn Bilberry, MD  guaiFENesin-codeine [facility]) 100-10 MG/5ML syrup Take 10 mLs by mouth 3 (three) times daily as needed for cough. 04/02/16   Triplett, Cari B, FNP    predniSONE (DELTASONE) 10 MG tablet Take 5 tablets (50 mg total) by mouth daily. 04/02/16   Triplett, Rulon Eisenmenger B, FNP  vancomycin (VANCOCIN HCL) 250 MG capsule Take 1 capsule (250 mg total) by mouth 4 (four) times daily. 01/27/15   Auburn Bilberry, MD    Allergies Patient has no known allergies.  Family History  Problem Relation Age of Onset  . Heart disease Father   . Diabetes Father     Social History Social History   Tobacco Use  . Smoking status: Never Smoker  . Smokeless tobacco: Current User    Types: Snuff  Substance Use Topics  . Alcohol use: No  . Drug use: No    Review of Systems Constitutional: No fever/chills Eyes: No visual changes. ENT: No sore throat. Cardiovascular: Denies chest pain. Respiratory: Cough and chest congestion. Gastrointestinal: No abdominal pain.  No nausea, no vomiting.  No diarrhea.  No constipation. Genitourinary: Negative for dysuria. Musculoskeletal: Negative for back pain. Skin: Negative for rash. Neurological: Negative for headaches, focal weakness or numbness.    ____________________________________________   PHYSICAL EXAM:  VITAL SIGNS: ED Triage Vitals  Enc Vitals Group     BP 11/07/17 0917 108/69     Pulse Rate 11/07/17 0917 74     Resp 11/07/17 0917 20     Temp 11/07/17 0917 98.5 F (36.9 C)     Temp Source 11/07/17 0917 Oral     SpO2 11/07/17 0917 95 %     Weight 11/07/17 0918 205 lb (93  kg)     Height 11/07/17 0918 5\' 8"  (1.727 m)     Head Circumference --      Peak Flow --      Pain Score 11/07/17 0922 3     Pain Loc --      Pain Edu? --      Excl. in GC? --    Constitutional: Alert and oriented. Well appearing and in no acute distress. Cardiovascular: Normal rate, regular rhythm. Grossly normal heart sounds.  Good peripheral circulation. Respiratory: Normal respiratory effort.  No retractions. Lungs CTAB. Neurologic:  Normal speech and language. No gross focal neurologic deficits are appreciated. No gait  instability. Skin:  Skin is warm, dry and intact. No rash noted. Psychiatric: Mood and affect are normal. Speech and behavior are normal.  ____________________________________________   LABS (all labs ordered are listed, but only abnormal results are displayed)  Labs Reviewed - No data to display ____________________________________________  EKG   ____________________________________________  RADIOLOGY  ED MD interpretation:    Official radiology report(s): Dg Chest 2 View  Result Date: 11/07/2017 CLINICAL DATA:  Productive cough, chest congestion for the past 3 weeks with increased severity over the past 3-4 days. Nonsmoker. EXAM: CHEST - 2 VIEW COMPARISON:  PA and lateral chest x-ray of April 02, 2016 FINDINGS: The lungs are well-expanded. There is patchy increased density in the left lower lobe. The heart and pulmonary vascularity are normal. There is calcification in the wall of the aortic arch. There is no pleural effusion. The bony thorax exhibits no acute abnormality. IMPRESSION: Left lower lobe pneumonia. Followup PA and lateral chest X-ray is recommended in 3-4 weeks following trial of antibiotic therapy to ensure resolution and exclude underlying malignancy. Electronically Signed   By: David  SwazilandJordan M.D.   On: 11/07/2017 10:26    ____________________________________________   PROCEDURES  Procedure(s) performed: None  Procedures  Critical Care performed: No  ____________________________________________   INITIAL IMPRESSION / ASSESSMENT AND PLAN / ED COURSE  As part of my medical decision making, I reviewed the following data within the electronic MEDICAL RECORD NUMBER    Cough and chest congestion.  Check x-rays consistent with left lower lobe pneumonia.  Discussed x-ray findings with patient.  Patient given discharge care instruction.  Patient advised take Zithromax and Bromfed-DM as directed.  Patient advised follow-up PCP in 3 to 4 weeks for repeat chest  x-ray.      ____________________________________________   FINAL CLINICAL IMPRESSION(S) / ED DIAGNOSES  Final diagnoses:  Community acquired pneumonia of left lower lobe of lung [facility](HCC)     ED Discharge Orders        Ordered    azithromycin (ZITHROMAX) 250 MG tablet     11/07/17 1101    brompheniramine-pseudoephedrine-DM 30-2-10 MG/5ML syrup  4 times daily PRN     11/07/17 1101       Note:  This document was prepared using Dragon voice recognition software and may include unintentional dictation errors.    Joni ReiningSmith, Berenize Gatlin K, PA-C 11/07/17 1104    Merrily Brittleifenbark, Neil, MD 11/07/17 [phone]

## 2017-11-07 NOTE — ED Triage Notes (Signed)
Pt c/o cough with congestion, with yellow sputum for the past 3-4 days. States he has been taking dayquil and niquil with no relief

## 2017-11-07 NOTE — ED Notes (Signed)
See triage note  Presents with a 3 week hx of cough   States cough became prod about 3-4 days ago  Yellowish phlegm   Denies any fever

## 2018-12-23 ENCOUNTER — Emergency Department: Payer: HRSA Program

## 2018-12-23 ENCOUNTER — Encounter: Payer: Self-pay | Admitting: Emergency Medicine

## 2018-12-23 ENCOUNTER — Emergency Department
Admission: EM | Admit: 2018-12-23 | Discharge: 2018-12-23 | Disposition: A | Discharge status: Home / Self Care | Payer: HRSA Program | Attending: Student in an Organized Health Care Education/Training Program | Admitting: Student in an Organized Health Care Education/Training Program

## 2018-12-23 ENCOUNTER — Other Ambulatory Visit: Payer: Self-pay

## 2018-12-23 DIAGNOSIS — F17290 Nicotine dependence, other tobacco product, uncomplicated: Secondary | ICD-10-CM | POA: Insufficient documentation

## 2018-12-23 DIAGNOSIS — R197 Diarrhea, unspecified: Secondary | ICD-10-CM | POA: Diagnosis not present

## 2018-12-23 DIAGNOSIS — U071 COVID-19: Secondary | ICD-10-CM | POA: Insufficient documentation

## 2018-12-23 DIAGNOSIS — A09 Infectious gastroenteritis and colitis, unspecified: Secondary | ICD-10-CM

## 2018-12-23 DIAGNOSIS — Z79899 Other long term (current) drug therapy: Secondary | ICD-10-CM | POA: Diagnosis not present

## 2018-12-23 DIAGNOSIS — R55 Syncope and collapse: Secondary | ICD-10-CM | POA: Diagnosis present

## 2018-12-23 LAB — CBC
HCT: 51.4 % (ref 39.0–52.0)
Hemoglobin: 17.3 g/dL — ABNORMAL HIGH (ref 13.0–17.0)
MCH: 31.8 pg (ref 26.0–34.0)
MCHC: 33.7 g/dL (ref 30.0–36.0)
MCV: 94.5 fL (ref 80.0–100.0)
Platelets: 154 10*3/uL (ref 150–400)
RBC: 5.44 MIL/uL (ref 4.22–5.81)
RDW: 12 % (ref 11.5–15.5)
WBC: 6.1 10*3/uL (ref 4.0–10.5)
nRBC: 0 % (ref 0.0–0.2)

## 2018-12-23 LAB — COMPREHENSIVE METABOLIC PANEL
ALT: 19 U/L (ref 0–44)
AST: 25 U/L (ref 15–41)
Albumin: 4.5 g/dL (ref 3.5–5.0)
Alkaline Phosphatase: 81 U/L (ref 38–126)
Anion gap: 11 (ref 5–15)
BUN: 13 mg/dL (ref 6–20)
CO2: 22 mmol/L (ref 22–32)
Calcium: 9.1 mg/dL (ref 8.9–10.3)
Chloride: 104 mmol/L (ref 98–111)
Creatinine, Ser: 1.34 mg/dL — ABNORMAL HIGH (ref 0.61–1.24)
GFR calc Af Amer: >60 mL/min (ref >60)
GFR calc non Af Amer: >60 mL/min (ref >60)
Glucose, Bld: 119 mg/dL — ABNORMAL HIGH (ref 70–99)
Potassium: 4.2 mmol/L (ref 3.5–5.1)
Sodium: 137 mmol/L (ref 135–145)
Total Bilirubin: 2.2 mg/dL — ABNORMAL HIGH (ref 0.3–1.2)
Total Protein: 7.9 g/dL (ref 6.5–8.1)

## 2018-12-23 LAB — SARS CORONAVIRUS 2 BY RT PCR (HOSPITAL ORDER, PERFORMED IN ~~LOC~~ HOSPITAL LAB): SARS Coronavirus 2: POSITIVE — AB

## 2018-12-23 MED ORDER — ACETAMINOPHEN 500 MG PO TABS
1000.0000 mg | ORAL_TABLET | Freq: Once | ORAL | Status: AC
  Administered 2018-12-23: 1000 mg via ORAL
  Filled 2018-12-23: qty 2

## 2018-12-23 MED ORDER — SODIUM CHLORIDE 0.9 % IV BOLUS
500.0000 mL | Freq: Once | INTRAVENOUS | Status: AC
  Administered 2018-12-23: 12:00:00 500 mL via INTRAVENOUS

## 2018-12-23 MED ORDER — ONDANSETRON HCL 4 MG/2ML IJ SOLN
4.0000 mg | Freq: Once | INTRAMUSCULAR | Status: AC
  Administered 2018-12-23: 14:00:00 4 mg via INTRAVENOUS
  Filled 2018-12-23: qty 2

## 2018-12-23 MED ORDER — LOPERAMIDE HCL 2 MG PO CAPS: 2.0000 mg | ORAL_CAPSULE | Freq: Once | ORAL | Status: DC

## 2018-12-23 MED ORDER — ONDANSETRON HCL 4 MG PO TABS: 4.0000 mg | ORAL_TABLET | Freq: Every day | ORAL | 0 refills | Status: AC | PRN

## 2018-12-23 NOTE — ED Notes (Signed)
Pt given water to drink at bedside.

## 2018-12-23 NOTE — ED Provider Notes (Signed)
[facility] Emergency Department Provider Note    First MD Initiated Contact with Patient 12/23/18 1025     (approximate)  I have reviewed the triage vital signs and the nursing notes.   HISTORY  Chief Complaint Near syncope   HPI Jeremy Spence is a 53 y.o. male presents the ER for evaluation of generalized malaise fatigue some shortness of breath as well as watery nonbloody non-melanotic diarrhea for the past few days.  Patient was initially going over to have an outpatient COVID test as he does have known exposure but while he was walking would be evaluated started feeling lightheaded like he was about to blackout.  States he never fully lost consciousness.  Denies any chest pain or palpitations.  Does feel thirsty.    Past Medical History:  Diagnosis Date  . GERD (gastroesophageal reflux disease)    Family History  Problem Relation Age of Onset  . Heart disease Father   . Diabetes Father    Past Surgical History:  Procedure Laterality Date  . FLEXIBLE SIGMOIDOSCOPY N/A 01/25/2015   Procedure: FLEXIBLE SIGMOIDOSCOPY;  Surgeon: Lollie Sails, MD;  Location: [facility] ENDOSCOPY;  Service: Endoscopy;  Laterality: N/A;  . INGUINAL HERNIA REPAIR    . STOMACH SURGERY     Patient Active Problem List   Diagnosis Date Noted  . Clostridium difficile colitis 01/20/2015  . Recurrent colitis due to Clostridium difficile 01/18/2015  . GERD (gastroesophageal reflux disease) 01/18/2015      Prior to Admission medications   Medication Sig Start Date End Date Taking? Authorizing Provider  acetaminophen (TYLENOL) 325 MG tablet Take 2 tablets (650 mg total) by mouth every 6 (six) hours as needed for mild pain (or Fever >/= 101). 01/27/15   Dustin Flock, MD  azithromycin (ZITHROMAX) 250 MG tablet 2 tablets today, then 1 tablet for the next 4 days. 11/07/17   Sable Feil, PA-C  brompheniramine-pseudoephedrine-DM 30-2-10 MG/5ML syrup Take 5 mLs by mouth  4 (four) times daily as needed. 11/07/17   Sable Feil, PA-C  famotidine (PEPCID) 20 MG tablet Take 1 tablet (20 mg total) by mouth 2 (two) times daily. 01/27/15   Dustin Flock, MD  guaiFENesin-codeine [facility]) 100-10 MG/5ML syrup Take 10 mLs by mouth 3 (three) times daily as needed for cough. 04/02/16   Triplett, Cari B, FNP  ondansetron (ZOFRAN) 4 MG tablet Take 1 tablet (4 mg total) by mouth daily as needed. 12/23/18 12/23/19  Merlyn Lot, MD  predniSONE (DELTASONE) 10 MG tablet Take 5 tablets (50 mg total) by mouth daily. 04/02/16   Triplett, Johnette Abraham B, FNP  vancomycin (VANCOCIN HCL) 250 MG capsule Take 1 capsule (250 mg total) by mouth 4 (four) times daily. 01/27/15   Dustin Flock, MD    Allergies Patient has no known allergies.    Social History Social History   Tobacco Use  . Smoking status: Never Smoker  . Smokeless tobacco: Current User    Types: Snuff  Substance Use Topics  . Alcohol use: No  . Drug use: No    Review of Systems Patient denies headaches, rhinorrhea, blurry vision, numbness, shortness of breath, chest pain, edema, cough, abdominal pain, nausea, vomiting, diarrhea, dysuria, fevers, rashes or hallucinations unless otherwise stated above in HPI. ____________________________________________   PHYSICAL EXAM:  VITAL SIGNS: Vitals:   12/23/18 1300 12/23/18 1400  BP: 110/64 111/67  Pulse: 84 77  Resp: (!) 26 (!) 27  Temp:  (!) 100.8 F (38.2 C)  SpO2: 98% 97%    Constitutional: Alert and oriented.  Eyes: Conjunctivae are normal.  Head: Atraumatic. Nose: No congestion/rhinnorhea. Mouth/Throat: Mucous membranes are moist.   Neck: No stridor. Painless ROM.  Cardiovascular: Normal rate, regular rhythm. Grossly normal heart sounds.  Good peripheral circulation. Respiratory: Normal respiratory effort.  No retractions. Lungs CTAB. Gastrointestinal: Soft and nontender. No distention. No abdominal bruits. No CVA tenderness. Genitourinary:   Musculoskeletal: No lower extremity tenderness nor edema.  No joint effusions. Neurologic:  Normal speech and language. No gross focal neurologic deficits are appreciated. No facial droop Skin:  Skin is warm, dry and intact. No rash noted. Psychiatric: Mood and affect are normal. Speech and behavior are normal.  ____________________________________________   LABS (all labs ordered are listed, but only abnormal results are displayed)  Results for orders placed or performed during the hospital encounter of 12/23/18 (from the past 24 hour(s))  CBC     Status: Abnormal   Collection Time: 12/23/18 10:35 AM  Result Value Ref Range   WBC 6.1 4.0 - 10.5 K/uL   RBC 5.44 4.22 - 5.81 MIL/uL   Hemoglobin 17.3 (H) 13.0 - 17.0 g/dL   HCT 37.3 42.8 - 76.8 %   MCV 94.5 80.0 - 100.0 fL   MCH 31.8 26.0 - 34.0 pg   MCHC 33.7 30.0 - 36.0 g/dL   RDW 11.5 72.6 - 20.3 %   Platelets 154 150 - 400 K/uL   nRBC 0.0 0.0 - 0.2 %  Comprehensive metabolic panel     Status: Abnormal   Collection Time: 12/23/18 10:35 AM  Result Value Ref Range   Sodium 137 135 - 145 mmol/L   Potassium 4.2 3.5 - 5.1 mmol/L   Chloride 104 98 - 111 mmol/L   CO2 22 22 - 32 mmol/L   Glucose, Bld 119 (H) 70 - 99 mg/dL   BUN 13 6 - 20 mg/dL   Creatinine, Ser 5.59 (H) 0.61 - 1.24 mg/dL   Calcium 9.1 8.9 - 74.1 mg/dL   Total Protein 7.9 6.5 - 8.1 g/dL   Albumin 4.5 3.5 - 5.0 g/dL   AST 25 15 - 41 U/L   ALT 19 0 - 44 U/L   Alkaline Phosphatase 81 38 - 126 U/L   Total Bilirubin 2.2 (H) 0.3 - 1.2 mg/dL   GFR calc non Af Amer >60 >60 mL/min   GFR calc Af Amer >60 >60 mL/min   Anion gap 11 5 - 15  SARS Coronavirus 2 [facility] order, Performed in [facility] hospital lab) Nasopharyngeal Nasopharyngeal Swab     Status: Abnormal   Collection Time: 12/23/18 10:35 AM   Specimen: Nasopharyngeal Swab  Result Value Ref Range   SARS Coronavirus 2 POSITIVE (A) NEGATIVE   ____________________________________________  EKG My review and  personal interpretation at Time: 10:16   Indication: syncope  Rate: 70  Rhythm: sinus Axis: normal Other: normal intervals, no stemi ____________________________________________  RADIOLOGY  I personally reviewed all radiographic images ordered to evaluate for the above acute complaints and reviewed radiology reports and findings.  These findings were personally discussed with the patient.  Please see medical record for radiology report.  ____________________________________________   PROCEDURES  Procedure(s) performed:  Procedures    Critical Care performed: no ____________________________________________   INITIAL IMPRESSION / ASSESSMENT AND PLAN / ED COURSE  Pertinent labs & imaging results that were available during my care of the patient were reviewed by me and considered in my medical decision making (see chart for  details).   DDX: dehydration, dysrhythmia, acs, pna, chf, electrolyte, abn, covid  Jeremy Spence is a 53 y.o. who presents to the ED with symptoms as described above.  Patient nontoxic-appearing.  Do suspect dehydration.  Possible COVID-19.  EKG does not show any evidence of dysrhythmia or acute ischemia.  Will keep on monitor.  He does not have any hypoxia.  The patient will be placed on continuous pulse oximetry and telemetry for monitoring.  Laboratory evaluation will be sent to evaluate for the above complaints.     Clinical Course as of Dec 22 1421  Mon Dec 23, 2018  1324 Patient feels improved after IV fluids.  States he is having frequent viral illness I do suspect he is low but dehydrated.  He is COVID positive.  No evidence of infiltrates.  He is not hypoxic.  Will trial p.o. challenge measure orthostatics and if he is able to ambulate otherwise feeling better I think he would be a candidate for outpatient follow-up.   [PR]  1403 Patient tolerating oral hydration.  Not orthostatic.  Feels improved after fluids.  Discussed signs and symptoms which he  should follow-up.  Encouraged p.o. intake.   [PR]    Clinical Course User Index [PR] Willy Eddyobinson, Joshiah Traynham, MD    The patient was evaluated in Emergency Department today for the symptoms described in the history of present illness. He/she was evaluated in the context of the global COVID-19 pandemic, which necessitated consideration that the patient might be at risk for infection with the SARS-CoV-2 virus that causes COVID-19. Institutional protocols and algorithms that pertain to the evaluation of patients at risk for COVID-19 are in a state of rapid change based on information released by regulatory bodies including the CDC and federal and state organizations. These policies and algorithms were followed during the patient's care in the ED.  As part of my medical decision making, I reviewed the following data within the electronic MEDICAL RECORD NUMBER Nursing notes reviewed and incorporated, Labs reviewed, notes from prior ED visits and [place] Controlled Substance Database   ____________________________________________   FINAL CLINICAL IMPRESSION(S) / ED DIAGNOSES  Final diagnoses:  COVID-19 virus infection  Diarrhea of infectious origin      NEW MEDICATIONS STARTED DURING THIS VISIT:  Discharge Medication List as of 12/23/2018  2:04 PM    START taking these medications   Details  ondansetron (ZOFRAN) 4 MG tablet Take 1 tablet (4 mg total) by mouth daily as needed., Starting Mon 12/23/2018, Until Tue 12/23/2019, Normal         Note:  This document was prepared using Dragon voice recognition software and may include unintentional dictation errors.    Willy Eddyobinson, Sarahy Creedon, MD 12/23/18 [phone]

## 2018-12-23 NOTE — ED Triage Notes (Signed)
PT had syncopal episode in car with friend. States he was on the way to get tested for covid. C/o fever and cough yesterday. PT is A&Ox4, VSS

## 2019-08-28 ENCOUNTER — Other Ambulatory Visit: Payer: Self-pay

## 2019-08-28 ENCOUNTER — Emergency Department
Admission: EM | Admit: 2019-08-28 | Discharge: 2019-08-28 | Disposition: A | Discharge status: Home / Self Care | Payer: 59 | Attending: Emergency Medicine | Admitting: Emergency Medicine

## 2019-08-28 ENCOUNTER — Emergency Department: Payer: 59

## 2019-08-28 DIAGNOSIS — R0602 Shortness of breath: Secondary | ICD-10-CM | POA: Diagnosis not present

## 2019-08-28 DIAGNOSIS — R0789 Other chest pain: Secondary | ICD-10-CM | POA: Diagnosis present

## 2019-08-28 DIAGNOSIS — F17290 Nicotine dependence, other tobacco product, uncomplicated: Secondary | ICD-10-CM | POA: Diagnosis not present

## 2019-08-28 DIAGNOSIS — R079 Chest pain, unspecified: Secondary | ICD-10-CM

## 2019-08-28 LAB — CBC
HCT: 42.6 % (ref 39.0–52.0)
Hemoglobin: 14.8 g/dL (ref 13.0–17.0)
MCH: 32.2 pg (ref 26.0–34.0)
MCHC: 34.7 g/dL (ref 30.0–36.0)
MCV: 92.8 fL (ref 80.0–100.0)
Platelets: 246 10*3/uL (ref 150–400)
RBC: 4.59 MIL/uL (ref 4.22–5.81)
RDW: 11.9 % (ref 11.5–15.5)
WBC: 5.3 10*3/uL (ref 4.0–10.5)
nRBC: 0 % (ref 0.0–0.2)

## 2019-08-28 LAB — BASIC METABOLIC PANEL
Anion gap: 7 (ref 5–15)
BUN: 16 mg/dL (ref 6–20)
CO2: 24 mmol/L (ref 22–32)
Calcium: 9.1 mg/dL (ref 8.9–10.3)
Chloride: 107 mmol/L (ref 98–111)
Creatinine, Ser: 0.95 mg/dL (ref 0.61–1.24)
GFR calc Af Amer: >60 mL/min (ref >60)
GFR calc non Af Amer: >60 mL/min (ref >60)
Glucose, Bld: 111 mg/dL — ABNORMAL HIGH (ref 70–99)
Potassium: 3.9 mmol/L (ref 3.5–5.1)
Sodium: 138 mmol/L (ref 135–145)

## 2019-08-28 LAB — TROPONIN I (HIGH SENSITIVITY)
Troponin I (High Sensitivity): 3 ng/L (ref <18)
Troponin I (High Sensitivity): 7 ng/L (ref <18)

## 2019-08-28 MED ORDER — NITROGLYCERIN 0.4 MG SL SUBL
0.4000 mg | SUBLINGUAL_TABLET | SUBLINGUAL | Status: DC | PRN
  Administered 2019-08-28: 0.4 mg via SUBLINGUAL
  Filled 2019-08-28 (×2): qty 1

## 2019-08-28 MED ORDER — SODIUM CHLORIDE 0.9% FLUSH: 3.0000 mL | Freq: Once | INTRAVENOUS | Status: DC

## 2019-08-28 MED ORDER — ASPIRIN EC 81 MG PO TBEC: 81.0000 mg | DELAYED_RELEASE_TABLET | Freq: Every day | ORAL | 2 refills | Status: AC

## 2019-08-28 MED ORDER — NITROGLYCERIN 0.4 MG SL SUBL
0.4000 mg | SUBLINGUAL_TABLET | SUBLINGUAL | 3 refills | Status: DC | PRN
Start: 2019-08-28 — End: 2021-06-16

## 2019-08-28 NOTE — ED Notes (Signed)
Pt denies CP/SHOB at this time.  

## 2019-08-28 NOTE — ED Provider Notes (Signed)
[facility] Emergency Department Provider Note  ____________________________________________   I have reviewed the triage vital signs and the nursing notes.   HISTORY  Chief Complaint Chest Pain   History limited by: Not Limited   HPI Jeremy Spence is a 54 y.o. male who presents to the emergency department today because of concerns for chest pain.  Patient states he was at work when it started.  Located in the center chest.  Describes it as pressure-like.  It was associated by some shortness of breath.  At the time my exam he is only complaining of some mild discomfort. States that he has never had pain like this before. Denies any recent illness. Denies any history of HTN or HLD. Uses oral tobacco but does not smoke. Does drink alcohol.   Records reviewed. Per medical record review patient has a history of GERD  Past Medical History:  Diagnosis Date  . GERD (gastroesophageal reflux disease)     Patient Active Problem List   Diagnosis Date Noted  . Clostridium difficile colitis 01/20/2015  . Recurrent colitis due to Clostridium difficile 01/18/2015  . GERD (gastroesophageal reflux disease) 01/18/2015    Past Surgical History:  Procedure Laterality Date  . FLEXIBLE SIGMOIDOSCOPY N/A 01/25/2015   Procedure: FLEXIBLE SIGMOIDOSCOPY;  Surgeon: Lollie Sails, MD;  Location: [facility] ENDOSCOPY;  Service: Endoscopy;  Laterality: N/A;  . INGUINAL HERNIA REPAIR    . STOMACH SURGERY      Prior to Admission medications   Medication Sig Start Date End Date Taking? Authorizing Provider  acetaminophen (TYLENOL) 325 MG tablet Take 2 tablets (650 mg total) by mouth every 6 (six) hours as needed for mild pain (or Fever >/= 101). 01/27/15   Dustin Flock, MD  azithromycin (ZITHROMAX) 250 MG tablet 2 tablets today, then 1 tablet for the next 4 days. 11/07/17   Sable Feil, PA-C  brompheniramine-pseudoephedrine-DM 30-2-10 MG/5ML syrup Take 5 mLs by mouth 4  (four) times daily as needed. 11/07/17   Sable Feil, PA-C  famotidine (PEPCID) 20 MG tablet Take 1 tablet (20 mg total) by mouth 2 (two) times daily. 01/27/15   Dustin Flock, MD  guaiFENesin-codeine [facility]) 100-10 MG/5ML syrup Take 10 mLs by mouth 3 (three) times daily as needed for cough. 04/02/16   Triplett, Cari B, FNP  ondansetron (ZOFRAN) 4 MG tablet Take 1 tablet (4 mg total) by mouth daily as needed. 12/23/18 12/23/19  Merlyn Lot, MD  predniSONE (DELTASONE) 10 MG tablet Take 5 tablets (50 mg total) by mouth daily. 04/02/16   Triplett, Johnette Abraham B, FNP  vancomycin (VANCOCIN HCL) 250 MG capsule Take 1 capsule (250 mg total) by mouth 4 (four) times daily. 01/27/15   Dustin Flock, MD    Allergies Patient has no known allergies.  Family History  Problem Relation Age of Onset  . Heart disease Father   . Diabetes Father     Social History Social History   Tobacco Use  . Smoking status: Never Smoker  . Smokeless tobacco: Current User    Types: Snuff  Substance Use Topics  . Alcohol use: No  . Drug use: No    Review of Systems Constitutional: No fever/chills Eyes: No visual changes. ENT: No sore throat. Cardiovascular: Positive for chest pain. Respiratory: Positive for shortness of breath. Gastrointestinal: No abdominal pain.  No nausea, no vomiting.  No diarrhea.   Genitourinary: Negative for dysuria. Musculoskeletal: Negative for back pain. Skin: Negative for rash. Neurological: Negative for headaches, focal  weakness or numbness.  ____________________________________________   PHYSICAL EXAM:  VITAL SIGNS: ED Triage Vitals  Enc Vitals Group     BP 08/28/19 1144 99/66     Pulse Rate 08/28/19 1144 69     Resp 08/28/19 1144 18     Temp 08/28/19 1144 98.4 F (36.9 C)     Temp Source 08/28/19 1144 Oral     SpO2 08/28/19 1144 97 %     Weight 08/28/19 1139 201 lb (91.2 kg)     Height 08/28/19 1139 5\' 7"  (1.702 m)     Head Circumference --      Peak  Flow --      Pain Score 08/28/19 1139 6   Constitutional: Alert and oriented.  Eyes: Conjunctivae are normal.  ENT      Head: Normocephalic and atraumatic.      Nose: No congestion/rhinnorhea.      Mouth/Throat: Mucous membranes are moist.      Neck: No stridor. Hematological/Lymphatic/Immunilogical: No cervical lymphadenopathy. Cardiovascular: Normal rate, regular rhythm.  No murmurs, rubs, or gallops.  Respiratory: Normal respiratory effort without tachypnea nor retractions. Breath sounds are clear and equal bilaterally. No wheezes/rales/rhonchi. Gastrointestinal: Soft and non tender. No rebound. No guarding.  Genitourinary: Deferred Musculoskeletal: Normal range of motion in all extremities. No lower extremity edema. Neurologic:  Normal speech and language. No gross focal neurologic deficits are appreciated.  Skin:  Skin is warm, dry and intact. No rash noted. Psychiatric: Mood and affect are normal. Speech and behavior are normal. Patient exhibits appropriate insight and judgment.  ____________________________________________    LABS (pertinent positives/negatives)  BMP wnl except glu 111 Trop hs 3 > 7 CBC wbc 5.3, hgb 14.8, plt 246  ____________________________________________   EKG  I, 08/30/19, attending physician, personally viewed and interpreted this EKG  EKG Time: 1103 Rate: 76 Rhythm: normal sinus rhythm Axis: normal Intervals: qtc 411 QRS: narrow ST changes: no st elevation Impression: normal ekg  ____________________________________________    RADIOLOGY  CXR No active cardiopulmonary disease ____________________________________________   PROCEDURES  Procedures  ____________________________________________   INITIAL IMPRESSION / ASSESSMENT AND PLAN / ED COURSE  Pertinent labs & imaging results that were available during my care of the patient were reviewed by me and considered in my medical decision making (see chart for details).    Patient presented to the emergency department today after episode of chest pain and shortness of breath.  The time my exam his chest pain had improved although he still had some residual discomfort.  Patient's troponins were negative x2.  EKG without any concerning ST elevation.  At this time the had a discussion with patient.  No evidence of heart damage.  I did still have some concerns for possible cardiac etiology though.  He did get some further relief of his residual discomfort with the nitroglycerin.  This point given lack of evidence of current heart damage I do think it is reasonable for patient to follow-up with cardiology as an outpatient.  I discussed strict return precautions with the patient.  Will give patient cardiology follow-up. ____________________________________________   FINAL CLINICAL IMPRESSION(S) / ED DIAGNOSES  Final diagnoses:  Nonspecific chest pain     Note: This dictation was prepared with Dragon dictation. Any transcriptional errors that result from this process are unintentional     Phineas Semen, MD 08/28/19 1928

## 2019-08-28 NOTE — ED Triage Notes (Signed)
Pt arrives via [facility] from work for c/o pain central chest pain that started about an hour ago while working. Pt A&Ox4 and in NAD. Denies radiation to back and shoulder.

## 2019-08-28 NOTE — Discharge Instructions (Signed)
As we discussed please seek medical attention for any further chest pain, shortness of breath, vomiting or any other new or concerning symptoms.

## 2019-08-28 NOTE — ED Notes (Signed)
Pt denies CP at this time. NAD noted.

## 2021-06-09 ENCOUNTER — Encounter: Payer: Self-pay | Admitting: Nurse Practitioner

## 2021-06-09 ENCOUNTER — Other Ambulatory Visit: Payer: Self-pay

## 2021-06-09 ENCOUNTER — Ambulatory Visit (INDEPENDENT_AMBULATORY_CARE_PROVIDER_SITE_OTHER): Payer: 59 | Admitting: Nurse Practitioner

## 2021-06-09 VITALS — BP 112/88 | HR 77 | Temp 97.6°F | Resp 10 | Ht 68.0 in | Wt 200.1 lb

## 2021-06-09 DIAGNOSIS — Z833 Family history of diabetes mellitus: Secondary | ICD-10-CM

## 2021-06-09 DIAGNOSIS — A0471 Enterocolitis due to Clostridium difficile, recurrent: Secondary | ICD-10-CM

## 2021-06-09 DIAGNOSIS — Z1211 Encounter for screening for malignant neoplasm of colon: Secondary | ICD-10-CM

## 2021-06-09 DIAGNOSIS — Z0000 Encounter for general adult medical examination without abnormal findings: Secondary | ICD-10-CM

## 2021-06-09 DIAGNOSIS — Z7689 Persons encountering health services in other specified circumstances: Secondary | ICD-10-CM

## 2021-06-09 DIAGNOSIS — Z125 Encounter for screening for malignant neoplasm of prostate: Secondary | ICD-10-CM

## 2021-06-09 NOTE — Progress Notes (Signed)
Acute Office Visit  Subjective:    Patient ID: Jeremy Spence, male    DOB: 08/13/1965, 56 y.o.   MRN: [id number]  Chief Complaint  Patient presents with   Establish Care    Previous PCP Phineas Real about 5 to 6 years ago    HPI Patient is in today for establish care and for complete physical and follow up of chronic conditions.  Immunizations: -Tetanus:per report utd -Influenza: refused -Covid-19: refused -Shingles: later currenlty battling shingles -Pneumonia: NA  -HPV: NA  Diet: Fair diet. 3 meals daily, No snacking. Soda 2 20oz sodas a day with some water Exercise: No regular exercise. Physcial   Eye exam: Completes annually May 10th  Dental exam: Completes semi-annually. Needs updating  Pap Smear: Completed in  Mammogram: Completed in  Dexa: Completed in Colonoscopy:  [place] PSA: Due today  Lung Cancer Screening:  NA  Sleep:in bed before 930 and up by 530. Feels rested. Snores  PHQ9 SCORE ONLY 06/09/2021  PHQ-9 Total Score 3        Past Medical History:  Diagnosis Date   GERD (gastroesophageal reflux disease)    Shingles     Past Surgical History:  Procedure Laterality Date   FLEXIBLE SIGMOIDOSCOPY N/A 01/25/2015   Procedure: FLEXIBLE SIGMOIDOSCOPY;  Surgeon: Christena Deem, MD;  Location: [facility] [place] ENDOSCOPY;  Service: Endoscopy;  Laterality: N/A;   INGUINAL HERNIA REPAIR     STOMACH SURGERY      Family History  Problem Relation Age of Onset   Hypertension Father    Heart disease Father    Diabetes Father     Social History   Socioeconomic History   Marital status: Single    Spouse name: Zella Ball   Number of children: 0   Years of education: Not on file   Highest education level: Not on file  Occupational History   Not on file  Tobacco Use   Smoking status: Never   Smokeless tobacco: Current    Types: Snuff   Tobacco comments:    dipping  Vaping Use   Vaping Use: Never used  Substance and Sexual Activity   Alcohol use:  Yes    Comment: once a month maybe   Drug use: No   Sexual activity: Not on file  Other Topics Concern   Not on file  Social History Narrative   Fulltime: Curator   Social Determinants of Health   Financial Resource Strain: Not on file  Food Insecurity: Not on file  Transportation Needs: Not on file  Physical Activity: Not on file  Stress: Not on file  Social Connections: Not on file  Intimate Partner Violence: Not on file    Outpatient Medications Prior to Visit  Medication Sig Dispense Refill   aspirin EC 81 MG tablet Take 81 mg by mouth daily. Swallow whole.     nitroGLYCERIN (NITROSTAT) 0.4 MG SL tablet Place 1 tablet (0.4 mg total) under the tongue every 5 (five) minutes as needed for chest pain. 100 tablet 3   acetaminophen (TYLENOL) 325 MG tablet Take 2 tablets (650 mg total) by mouth every 6 (six) hours as needed for mild pain (or Fever >/= 101). 1 tablet 0   azithromycin (ZITHROMAX) 250 MG tablet 2 tablets today, then 1 tablet for the next 4 days. 6 each 0   brompheniramine-pseudoephedrine-DM 30-2-10 MG/5ML syrup Take 5 mLs by mouth 4 (four) times daily as needed. 120 mL 0   famotidine (PEPCID) 20 MG tablet Take 1 tablet (  20 mg total) by mouth 2 (two) times daily. 60 tablet 0   guaiFENesin-codeine (ROBITUSSIN AC) 100-10 MG/5ML syrup Take 10 mLs by mouth 3 (three) times daily as needed for cough. 120 mL 0   predniSONE (DELTASONE) 10 MG tablet Take 5 tablets (50 mg total) by mouth daily. 25 tablet 0   vancomycin (VANCOCIN HCL) 250 MG capsule Take 1 capsule (250 mg total) by mouth 4 (four) times daily. 44 capsule 0   No facility-administered medications prior to visit.    No Known Allergies  Review of Systems  Constitutional:  Negative for chills, fatigue and fever.  Eyes:  Negative for visual disturbance.  Respiratory:  Negative for cough and shortness of breath.   Cardiovascular:  Negative for chest pain and leg swelling.  Gastrointestinal:  Negative for abdominal  pain, diarrhea, nausea and vomiting.       BM daily  Genitourinary:  Negative for difficulty urinating, penile discharge, penile pain, penile swelling, scrotal swelling and testicular pain.       Nocturia "+" once a night intermittently   Neurological:  Negative for dizziness, light-headedness, numbness and headaches.  Psychiatric/Behavioral:  Negative for hallucinations and suicidal ideas.       Objective:    Physical Exam Vitals and nursing note reviewed. Exam conducted with a chaperone present [facility](Anastasiya MachiasHopkins, RMA).  Constitutional:      Appearance: Normal appearance.  HENT:     Right Ear: Tympanic membrane, ear canal and external ear normal.     Left Ear: Tympanic membrane, ear canal and external ear normal.     Mouth/Throat:     Mouth: Mucous membranes are moist.     Pharynx: Oropharynx is clear.  Eyes:     Extraocular Movements: Extraocular movements intact.     Pupils: Pupils are equal, round, and reactive to light.  Neck:     Thyroid: No thyroid mass, thyromegaly or thyroid tenderness.  Cardiovascular:     Rate and Rhythm: Normal rate and regular rhythm.     Pulses: Normal pulses.     Heart sounds: Normal heart sounds.  Pulmonary:     Effort: Pulmonary effort is normal.     Breath sounds: Normal breath sounds.  Abdominal:     General: Bowel sounds are normal. There is no distension.     Palpations: There is no mass.     Tenderness: There is no abdominal tenderness.     Hernia: No hernia is present. There is no hernia in the left inguinal area or right inguinal area.  Genitourinary:    Penis: Normal and circumcised.      Testes: Normal.     Epididymis:     Right: Normal.     Left: Normal.  Musculoskeletal:     Right lower leg: No edema.     Left lower leg: No edema.  Lymphadenopathy:     Cervical: No cervical adenopathy.     Lower Body: No right inguinal adenopathy. No left inguinal adenopathy.  Skin:    General: Skin is warm.  Neurological:     General:  No focal deficit present.     Mental Status: He is alert.     Deep Tendon Reflexes:     Reflex Scores:      Bicep reflexes are 2+ on the right side and 2+ on the left side.      Patellar reflexes are 2+ on the right side and 2+ on the left side.    Comments: Bilateral upper and  lower extremity strength 5/5  Psychiatric:        Mood and Affect: Mood normal.        Behavior: Behavior normal.        Thought Content: Thought content normal.        Judgment: Judgment normal.    BP 112/88    Pulse 77    Temp 97.6 F (36.4 C)    Resp 10    Ht 5\' 8"  (1.727 m)    Wt 200 lb 1 oz (90.7 kg)    SpO2 97%    BMI 30.42 kg/m  Wt Readings from Last 3 Encounters:  06/09/21 200 lb 1 oz (90.7 kg)  08/28/19 201 lb (91.2 kg)  12/23/18 200 lb (90.7 kg)    Health Maintenance Due  Topic Date Due   HIV Screening  Never done   Hepatitis C Screening  Never done   TETANUS/TDAP  Never done   COLONOSCOPY (Pts 45-35yrs Insurance coverage will need to be confirmed)  Never done   Zoster Vaccines- Shingrix (1 of 2) Never done    There are no preventive care reminders to display for this patient.   No results found for: TSH Lab Results  Component Value Date   WBC 5.3 08/28/2019   HGB 14.8 08/28/2019   HCT 42.6 08/28/2019   MCV 92.8 08/28/2019   PLT 246 08/28/2019   Lab Results  Component Value Date   NA 138 08/28/2019   K 3.9 08/28/2019   CO2 24 08/28/2019   GLUCOSE 111 (H) 08/28/2019   BUN 16 08/28/2019   CREATININE 0.95 08/28/2019   BILITOT 2.2 (H) 12/23/2018   ALKPHOS 81 12/23/2018   AST 25 12/23/2018   ALT 19 12/23/2018   PROT 7.9 12/23/2018   ALBUMIN 4.5 12/23/2018   CALCIUM 9.1 08/28/2019   ANIONGAP 7 08/28/2019   No results found for: CHOL No results found for: HDL No results found for: LDLCALC No results found for: TRIG No results found for: CHOLHDL No results found for: 08/30/2019     Assessment & Plan:   Problem List Items Addressed This Visit       Digestive   Recurrent  colitis due to Clostridium difficile    History of the same.  Has had a flex sigmoidoscopy before has had been treated with vancomycin and azithromycin in the past        Other   Annual physical exam    Discussed age-appropriate screenings and immunizations.  Encouraged healthy lifestyle orders placed      Relevant Orders   CBC with Differential/Platelet   Comprehensive metabolic panel   Hemoglobin A1c   Lipid panel   Encounter to establish care with new doctor   Screening for prostate cancer    PSA ordered today.  Pending labs asymptomatic      Relevant Orders   PSA   Screening for colon cancer - Primary    Ambulatory referral placed for gastroenterology      Relevant Orders   Ambulatory referral to Gastroenterology   Family history of diabetes mellitus     No orders of the defined types were placed in this encounter.  This visit occurred during the SARS-CoV-2 public health emergency.  Safety protocols were in place, including screening questions prior to the visit, additional usage of staff PPE, and extensive cleaning of exam room while observing appropriate contact time as indicated for disinfecting solutions.    [id number], NP

## 2021-06-09 NOTE — Patient Instructions (Signed)
Nice to see you today ?I will be in touch in regards to your labs ?Follow up with me in 1 year, sooner if you need it.  ?If your labs are off I will call you in sooner for a visit ?

## 2021-06-09 NOTE — Assessment & Plan Note (Signed)
Ambulatory referral placed for gastroenterology ?

## 2021-06-09 NOTE — Assessment & Plan Note (Signed)
History of the same.  Has had a flex sigmoidoscopy before has had been treated with vancomycin and azithromycin in the past ?

## 2021-06-09 NOTE — Assessment & Plan Note (Signed)
PSA ordered today.  Pending labs asymptomatic ?

## 2021-06-09 NOTE — Assessment & Plan Note (Signed)
Discussed age-appropriate screenings and immunizations.  Encouraged healthy lifestyle orders placed ?

## 2021-06-10 ENCOUNTER — Telehealth: Payer: Self-pay

## 2021-06-10 LAB — CBC WITH DIFFERENTIAL/PLATELET
Basophils Absolute: 0.1 10*3/uL (ref 0.0–0.1)
Basophils Relative: 0.8 % (ref 0.0–3.0)
Eosinophils Absolute: 0.2 10*3/uL (ref 0.0–0.7)
Eosinophils Relative: 2.4 % (ref 0.0–5.0)
HCT: 43.5 % (ref 39.0–52.0)
Hemoglobin: 14.9 g/dL (ref 13.0–17.0)
Lymphocytes Relative: 25.5 % (ref 12.0–46.0)
Lymphs Abs: 1.8 10*3/uL (ref 0.7–4.0)
MCHC: 34.2 g/dL (ref 30.0–36.0)
MCV: 95.9 fl (ref 78.0–100.0)
Monocytes Absolute: 0.5 10*3/uL (ref 0.1–1.0)
Monocytes Relative: 7.6 % (ref 3.0–12.0)
Neutro Abs: 4.4 10*3/uL (ref 1.4–7.7)
Neutrophils Relative %: 63.7 % (ref 43.0–77.0)
Platelets: 225 10*3/uL (ref 150.0–400.0)
RBC: 4.54 Mil/uL (ref 4.22–5.81)
RDW: 12.2 % (ref 11.5–15.5)
WBC: 6.9 10*3/uL (ref 4.0–10.5)

## 2021-06-10 LAB — COMPREHENSIVE METABOLIC PANEL
ALT: 30 U/L (ref 0–53)
AST: 24 U/L (ref 0–37)
Albumin: 4.5 g/dL (ref 3.5–5.2)
Alkaline Phosphatase: 85 U/L (ref 39–117)
BUN: 15 mg/dL (ref 6–23)
CO2: 27 mEq/L (ref 19–32)
Calcium: 9.5 mg/dL (ref 8.4–10.5)
Chloride: 104 mEq/L (ref 96–112)
Creatinine, Ser: 1.08 mg/dL (ref 0.40–1.50)
GFR: 77.73 mL/min (ref >60.00)
Glucose, Bld: 80 mg/dL (ref 70–99)
Potassium: 4.1 mEq/L (ref 3.5–5.1)
Sodium: 139 mEq/L (ref 135–145)
Total Bilirubin: 1.4 mg/dL — ABNORMAL HIGH (ref 0.2–1.2)
Total Protein: 7.4 g/dL (ref 6.0–8.3)

## 2021-06-10 LAB — PSA: PSA: 0.4 ng/mL (ref 0.10–4.00)

## 2021-06-10 LAB — LIPID PANEL
Cholesterol: 183 mg/dL (ref 0–200)
HDL: 38.4 mg/dL — ABNORMAL LOW (ref >39.00)
LDL Cholesterol: 125 mg/dL — ABNORMAL HIGH (ref 0–99)
NonHDL: 144.38
Total CHOL/HDL Ratio: 5
Triglycerides: 96 mg/dL (ref 0.0–149.0)
VLDL: 19.2 mg/dL (ref 0.0–40.0)

## 2021-06-10 LAB — HEMOGLOBIN A1C: Hgb A1c MFr Bld: 5.1 % (ref 4.6–6.5)

## 2021-06-10 NOTE — Telephone Encounter (Signed)
CALLED PATIENT NO ANSWER LEFT VOICEMAIL FOR A CALL BACK °Letter sent °

## 2021-06-14 ENCOUNTER — Encounter: Payer: Self-pay | Admitting: Nurse Practitioner

## 2021-06-16 ENCOUNTER — Other Ambulatory Visit: Payer: Self-pay

## 2021-06-16 MED ORDER — NA SULFATE-K SULFATE-MG SULF 17.5-3.13-1.6 GM/177ML PO SOLN: 1.0000 | Freq: Once | ORAL | 0 refills | Status: AC

## 2021-06-16 NOTE — Progress Notes (Signed)
Gastroenterology Pre-Procedure Review ? ?Request Date: 07/29/2021 ?Requesting Physician: Dr. Allegra Lai ? ?PATIENT REVIEW QUESTIONS: The patient responded to the following health history questions as indicated:   ? ?1. Are you having any GI issues? no ?2. Do you have a personal history of Polyps? no ?3. Do you have a family history of Colon Cancer or Polyps? yes (polyp) ?4. Diabetes Mellitus? no ?5. Joint replacements in the past 12 months?no ?6. Major health problems in the past 3 months?shingles  1 month ago and patient stated he only has two linings in stomach  ?7. Any artificial heart valves, MVP, or defibrillator?no ?   ?MEDICATIONS & ALLERGIES:    ?Patient reports the following regarding taking any anticoagulation/antiplatelet therapy:   ?Plavix, Coumadin, Eliquis, Xarelto, Lovenox, Pradaxa, Brilinta, or Effient? no ?Aspirin? no ? ?Patient confirms/reports the following medications:  ?Current Outpatient Medications  ?Medication Sig Dispense Refill  ? aspirin EC 81 MG tablet Take 81 mg by mouth daily. Swallow whole.    ? nitroGLYCERIN (NITROSTAT) 0.4 MG SL tablet Place 1 tablet (0.4 mg total) under the tongue every 5 (five) minutes as needed for chest pain. 100 tablet 3  ? ?No current facility-administered medications for this visit.  ? ? ?Patient confirms/reports the following allergies:  ?No Known Allergies ? ?No orders of the defined types were placed in this encounter. ? ? ?AUTHORIZATION INFORMATION ?Primary Insurance: ?1D#: ?Group #: ? ?Secondary Insurance: ?1D#: ?Group #: ? ?SCHEDULE INFORMATION: ?Date: 07/29/2021 ?Time: ?Location:[facility] ? ?

## 2021-07-26 ENCOUNTER — Telehealth: Payer: Self-pay

## 2021-07-26 NOTE — Telephone Encounter (Signed)
Patient called had a few question si answered them and patient is good now ?

## 2021-07-28 ENCOUNTER — Encounter: Payer: Self-pay | Admitting: Gastroenterology

## 2021-07-29 ENCOUNTER — Encounter: Payer: Self-pay | Admitting: Gastroenterology

## 2021-07-29 ENCOUNTER — Ambulatory Visit: Payer: 59 | Admitting: Anesthesiology

## 2021-07-29 ENCOUNTER — Ambulatory Visit
Admission: RE | Admit: 2021-07-29 | Discharge: 2021-07-29 | Disposition: A | Discharge status: Home / Self Care | Payer: 59 | Attending: Gastroenterology | Admitting: Gastroenterology

## 2021-07-29 ENCOUNTER — Encounter
Admission: RE | Disposition: A | Discharge status: Home / Self Care | Payer: Self-pay | Source: Home / Self Care | Attending: Gastroenterology

## 2021-07-29 DIAGNOSIS — K5730 Diverticulosis of large intestine without perforation or abscess without bleeding: Secondary | ICD-10-CM | POA: Diagnosis not present

## 2021-07-29 DIAGNOSIS — Z1211 Encounter for screening for malignant neoplasm of colon: Secondary | ICD-10-CM | POA: Insufficient documentation

## 2021-07-29 DIAGNOSIS — Z0000 Encounter for general adult medical examination without abnormal findings: Secondary | ICD-10-CM

## 2021-07-29 DIAGNOSIS — K219 Gastro-esophageal reflux disease without esophagitis: Secondary | ICD-10-CM | POA: Insufficient documentation

## 2021-07-29 HISTORY — PX: COLONOSCOPY WITH PROPOFOL: SHX5780

## 2021-07-29 SURGERY — COLONOSCOPY WITH PROPOFOL
Anesthesia: General

## 2021-07-29 MED ORDER — LIDOCAINE HCL (CARDIAC) PF 100 MG/5ML IV SOSY
PREFILLED_SYRINGE | INTRAVENOUS | Status: DC | PRN
  Administered 2021-07-29: 100 mg via INTRAVENOUS

## 2021-07-29 MED ORDER — SODIUM CHLORIDE 0.9 % IV SOLN
INTRAVENOUS | Status: DC
  Administered 2021-07-29: 1000 mL via INTRAVENOUS

## 2021-07-29 MED ORDER — STERILE WATER FOR IRRIGATION IR SOLN
Status: DC | PRN
  Administered 2021-07-29: 60 mL

## 2021-07-29 MED ORDER — PROPOFOL 10 MG/ML IV BOLUS
INTRAVENOUS | Status: AC
  Filled 2021-07-29: qty 40

## 2021-07-29 MED ORDER — PROPOFOL 10 MG/ML IV BOLUS
INTRAVENOUS | Status: DC | PRN
  Administered 2021-07-29: 150 mg via INTRAVENOUS
  Administered 2021-07-29: 120 ug/kg/min via INTRAVENOUS

## 2021-07-29 NOTE — H&P (Signed)
?  Jeremy Darby, MD ?[address]  ?[address]  ?[place], [place] [address]  ?Main: [phone]  ?Fax: [phone] ?Pager: [phone] ? ?Primary Care Physician:  Michela Pitcher, NP ?Primary Gastroenterologist:  Dr. Cephas Spence ? ?Pre-Procedure History & Physical: ?HPI:  Jeremy Spence is a 56 y.o. adult is here for an colonoscopy. ?  ?Past Medical History:  ?Diagnosis Date  ? GERD (gastroesophageal reflux disease)   ? Shingles   ? ? ?Past Surgical History:  ?Procedure Laterality Date  ? FLEXIBLE SIGMOIDOSCOPY N/A 01/25/2015  ? Procedure: FLEXIBLE SIGMOIDOSCOPY;  Surgeon: Lollie Sails, MD;  Location: [facility] ENDOSCOPY;  Service: Endoscopy;  Laterality: N/A;  ? INGUINAL HERNIA REPAIR    ? STOMACH SURGERY    ? ? ?Prior to Admission medications   ?Not on File  ? ? ?Allergies as of 06/17/2021  ? (No Known Allergies)  ? ? ?Family History  ?Problem Relation Age of Onset  ? Hypertension Father   ? Heart disease Father   ? Diabetes Father   ? ? ?Social History  ? ?Socioeconomic History  ? Marital status: Single  ?  Spouse name: Jeremy Spence  ? Number of children: 0  ? Years of education: Not on file  ? Highest education level: Not on file  ?Occupational History  ? Not on file  ?Tobacco Use  ? Smoking status: Never  ? Smokeless tobacco: Current  ?  Types: Snuff  ? Tobacco comments:  ?  dipping  ?Vaping Use  ? Vaping Use: Never used  ?Substance and Sexual Activity  ? Alcohol use: Yes  ?  Comment: once a month maybe  ? Drug use: No  ? Sexual activity: Not on file  ?Other Topics Concern  ? Not on file  ?Social History Narrative  ? Fulltime: [employer]  ? ?Social Determinants of Health  ? ?Financial Resource Strain: Not on file  ?Food Insecurity: Not on file  ?Transportation Needs: Not on file  ?Physical Activity: Not on file  ?Stress: Not on file  ?Social Connections: Not on file  ?Intimate Partner Violence: Not on file  ? ? ?Review of Systems: ?See HPI, otherwise negative ROS ? ?Physical Exam: ?BP 116/89   Pulse 61    Temp (!) 97 ?F (36.1 ?C) (Temporal)   Resp 18   Ht 5\' 7"  (1.702 m)   Wt 95.5 kg   SpO2 98%   BMI 32.96 kg/m?  ?General:   Alert,  pleasant and cooperative in NAD ?Head:  Normocephalic and atraumatic. ?Neck:  Supple; no masses or thyromegaly. ?Lungs:  Clear throughout to auscultation.    ?Heart:  Regular rate and rhythm. ?Abdomen:  Soft, nontender and nondistended. Normal bowel sounds, without guarding, and without rebound.   ?Neurologic:  Alert and  oriented x4;  grossly normal neurologically. ? ?Impression/Plan: ?Jeremy Spence is here for an colonoscopy to be performed for colon cancer screening ? ?Risks, benefits, limitations, and alternatives regarding  colonoscopy have been reviewed with the patient.  Questions have been answered.  All parties agreeable. ? ? ?Sherri Sear, MD  07/29/2021, 10:07 AM ?

## 2021-07-29 NOTE — Anesthesia Postprocedure Evaluation (Signed)
Anesthesia Post Note ? ?Patient: Jeremy Spence ? ?Procedure(s) Performed: COLONOSCOPY WITH PROPOFOL ? ?Patient location during evaluation: Endoscopy ?Anesthesia Type: General ?Level of consciousness: awake and alert ?Pain management: pain level controlled ?Vital Signs Assessment: post-procedure vital signs reviewed and stable ?Respiratory status: spontaneous breathing, nonlabored ventilation and respiratory function stable ?Cardiovascular status: blood pressure returned to baseline and stable ?Postop Assessment: no apparent nausea or vomiting ?Anesthetic complications: no ? ? ?No notable events documented. ? ? ?Last Vitals:  ?Vitals:  ? 07/29/21 1125 07/29/21 1145  ?BP: 116/90 118/85  ?Pulse:    ?Resp:    ?Temp:    ?SpO2:    ?  ?Last Pain:  ?Vitals:  ? 07/29/21 1145  ?TempSrc:   ?PainSc: 0-No pain  ? ? ?  ?  ?  ?  ?  ?  ? ?Foye Deer ? ? ? ? ?

## 2021-07-29 NOTE — Transfer of Care (Signed)
Immediate Anesthesia Transfer of Care Note ? ?Patient: Jeremy Spence ? ?Procedure(s) Performed: COLONOSCOPY WITH PROPOFOL ? ?Patient Location: PACU ? ?Anesthesia Type:General ? ?Level of Consciousness: drowsy ? ?Airway & Oxygen Therapy: Patient Spontanous Breathing ? ?Post-op Assessment: Report given to RN and Post -op Vital signs reviewed and stable ? ?Post vital signs: Reviewed and stable ? ?Last Vitals:  ?Vitals Value Taken Time  ?BP 100/70 07/29/21 1111  ?Temp 35.9   ?Pulse 58 07/29/21 1112  ?Resp 13 07/29/21 1112  ?SpO2 98 % 07/29/21 1112  ?Vitals shown include unvalidated device data. ? ?Last Pain:  ?Vitals:  ? 07/29/21 0957  ?TempSrc: Temporal  ?PainSc: 0-No pain  ?   ? ?  ? ?Complications: No notable events documented. ?

## 2021-07-29 NOTE — Op Note (Signed)
[facility] ?Gastroenterology ?Patient Name: Jeremy Spence ?Procedure Date: 07/29/2021 10:45 AM ?MRN: [id number] ?Account #: [id number] ?Date of Birth: 09-02-1965 ?Admit Type: Outpatient ?Age: 56 ?Room: [facility] ENDO ROOM 4 ?Gender: Male ?Note Status: Finalized ?Instrument Name: Colonscope [id number] ?Procedure:             Colonoscopy ?Indications:           Screening for colorectal malignant neoplasm, This is  ?                       the patient's first colonoscopy ?Providers:             Toney Reil MD, MD ?Referring MD:          Genene Churn. Toney Reil (Referring MD) ?Medicines:             General Anesthesia ?Complications:         No immediate complications. Estimated blood loss: None. ?Procedure:             Pre-Anesthesia Assessment: ?                       - Prior to the procedure, a History and Physical was  ?                       performed, and patient medications and allergies were  ?                       reviewed. The patient is competent. The risks and  ?                       benefits of the procedure and the sedation options and  ?                       risks were discussed with the patient. All questions  ?                       were answered and informed consent was obtained.  ?                       Patient identification and proposed procedure were  ?                       verified by the physician, the nurse, the  ?                       anesthesiologist, the anesthetist and the technician  ?                       in the pre-procedure area in the procedure room in the  ?                       endoscopy suite. Mental Status Examination: alert and  ?                       oriented. Airway Examination: normal oropharyngeal  ?                       airway and neck mobility. Respiratory Examination:  ?  clear to auscultation. CV Examination: normal.  ?                       Prophylactic Antibiotics: The patient does not require  ?                       prophylactic  antibiotics. Prior Anticoagulants: The  ?                       patient has taken no previous anticoagulant or  ?                       antiplatelet agents. ASA Grade Assessment: II - A  ?                       patient with mild systemic disease. After reviewing  ?                       the risks and benefits, the patient was deemed in  ?                       satisfactory condition to undergo the procedure. The  ?                       anesthesia plan was to use general anesthesia.  ?                       Immediately prior to administration of medications,  ?                       the patient was re-assessed for adequacy to receive  ?                       sedatives. The heart rate, respiratory rate, oxygen  ?                       saturations, blood pressure, adequacy of pulmonary  ?                       ventilation, and response to care were monitored  ?                       throughout the procedure. The physical status of the  ?                       patient was re-assessed after the procedure. ?                       After obtaining informed consent, the colonoscope was  ?                       passed under direct vision. Throughout the procedure,  ?                       the patient's blood pressure, pulse, and oxygen  ?                       saturations were monitored continuously. The  ?  Colonoscope was introduced through the anus and  ?                       advanced to the the cecum, identified by appendiceal  ?                       orifice and ileocecal valve. The colonoscopy was  ?                       performed without difficulty. The patient tolerated  ?                       the procedure well. The quality of the bowel  ?                       preparation was evaluated using the BBPS [facility]([place] Bowel  ?                       Preparation Scale) with scores of: Right Colon = 3,  ?                       Transverse Colon = 3 and Left Colon = 3 (entire mucosa  ?                        seen well with no residual staining, small fragments  ?                       of stool or opaque liquid). The total BBPS score  ?                       equals 9. ?Findings: ?     The perianal and digital rectal examinations were normal. Pertinent  ?     negatives include normal sphincter tone and no palpable rectal lesions. ?     The entire examined colon appeared normal. ?     A few diverticula were found in the sigmoid colon. ?     The retroflexed view of the distal rectum and anal verge was normal and  ?     showed no anal or rectal abnormalities. ?Impression:            - The entire examined colon is normal. ?                       - Diverticulosis in the sigmoid colon. ?                       - The distal rectum and anal verge are normal on  ?                       retroflexion view. ?                       - No specimens collected. ?Recommendation:        - Discharge patient to home (with escort). ?                       - Resume previous diet today. ?                       -  Continue present medications. ?                       - Repeat colonoscopy in 10 years for screening  ?                       purposes. ?Procedure Code(s):     --- Professional --- ?                       I1443, Colorectal cancer screening; colonoscopy on  ?                       individual not meeting criteria for high risk ?Diagnosis Code(s):     --- Professional --- ?                       Z12.11, Encounter for screening for malignant neoplasm  ?                       of colon ?                       K57.30, Diverticulosis of large intestine without  ?                       perforation or abscess without bleeding ?CPT copyright 2019 American Medical Association. All rights reserved. ?The codes documented in this report are preliminary and upon coder review may  ?be revised to meet current compliance requirements. ?Dr. Libby Maw ?Dallen Bunte Alger Memos MD, MD ?07/29/2021 11:08:56 AM ?This report has been signed electronically. ?Number of  Addenda: 0 ?Note Initiated On: 07/29/2021 10:45 AM ?Scope Withdrawal Time: 0 hours 7 minutes 3 seconds  ?Total Procedure Duration: 0 hours 8 minutes 49 seconds  ?Estimated Blood Loss:  Estimated blood loss: none. ?     [facility] ?

## 2021-07-29 NOTE — Anesthesia Preprocedure Evaluation (Signed)
Anesthesia Evaluation  ?Patient identified by MRN, date of birth, ID band ?Patient awake ? ? ? ?Reviewed: ?Allergy & Precautions, NPO status , Patient's Chart, lab work & pertinent test results ? ?Airway ?Mallampati: I ? ?TM Distance: >3 FB ?Neck ROM: full ? ? ? Dental ?no notable dental hx. ? ?  ?Pulmonary ?neg pulmonary ROS,  ?  ?Pulmonary exam normal ? ? ? ? ? ? ? Cardiovascular ?negative cardio ROS ?Normal cardiovascular exam ? ? ?  ?Neuro/Psych ?negative neurological ROS ? negative psych ROS  ? GI/Hepatic ?negative GI ROS, Neg liver ROS,   ?Endo/Other  ?negative endocrine ROS ? Renal/GU ?negative Renal ROS  ?negative genitourinary ?  ?Musculoskeletal ? ? Abdominal ?Normal abdominal exam  (+)   ?Peds ? Hematology ?negative hematology ROS ?(+)   ?Anesthesia Other Findings ?Past Medical History: ?No date: GERD (gastroesophageal reflux disease) ?No date: Shingles ? ?Past Surgical History: ?01/25/2015: FLEXIBLE SIGMOIDOSCOPY; N/A ?    Comment:  Procedure: FLEXIBLE SIGMOIDOSCOPY;  Surgeon: Cindra Eves  ?             Marva Panda, MD;  Location: [facility] ENDOSCOPY;  Service:  ?             Endoscopy;  Laterality: N/A; ?No date: INGUINAL HERNIA REPAIR ?No date: STOMACH SURGERY ? ?BMI   ? Body Mass Index: 32.96 kg/m?  ?  ? ? Reproductive/Obstetrics ?negative OB ROS ? ?  ? ? ? ? ? ? ? ? ? ? ? ? ? ?  ?  ? ? ? ? ? ? ? ? ?Anesthesia Physical ?Anesthesia Plan ? ?ASA: 2 ? ?Anesthesia Plan: General  ? ?Post-op Pain Management:   ? ?Induction: Intravenous ? ?PONV Risk Score and Plan: Propofol infusion and TIVA ? ?Airway Management Planned: Natural Airway ? ?Additional Equipment:  ? ?Intra-op Plan:  ? ?Post-operative Plan:  ? ?Informed Consent: I have reviewed the patients History and Physical, chart, labs and discussed the procedure including the risks, benefits and alternatives for the proposed anesthesia with the patient or authorized representative who has indicated his/her understanding and  acceptance.  ? ? ? ?Dental Advisory Given ? ?Plan Discussed with: Anesthesiologist, CRNA and Surgeon ? ?Anesthesia Plan Comments:   ? ? ? ? ? ? ?Anesthesia Quick Evaluation ? ?

## 2021-08-01 ENCOUNTER — Encounter: Payer: Self-pay | Admitting: Gastroenterology

## 2023-02-16 ENCOUNTER — Telehealth: Payer: Self-pay

## 2023-02-16 NOTE — Transitions of Care (Post Inpatient/ED Visit) (Signed)
Unable to reach patient by phone and left v/m requesting call back at [phone].        02/16/2023  Name: Jeremy Spence MRN: [id number] DOB: 02-12-1966  Today's TOC FU Call Status: Today's TOC FU Call Status:: Unsuccessful Call (1st Attempt) Unsuccessful Call (1st Attempt) Date: 02/16/23  Attempted to reach the patient regarding the most recent Inpatient/ED visit.  Follow Up Plan: Additional outreach attempts will be made to reach the patient to complete the Transitions of Care (Post Inpatient/ED visit) call.   Signature Lewanda Rife, LPN

## 2023-02-20 NOTE — Transitions of Care (Post Inpatient/ED Visit) (Signed)
Pt seen [facility] ED on 02/11/23;pain in lower lt back and dx sciatica.  Pt said on no prescribed meds; pt is taking OTC med for back pain; not sure of name of med. Pt did want to schedule FU ED appt with Audria Nine NP; pt is back at work and can only come in late afternoon. Pt scheduled appt with Audria Nine NP on 02/23/23 at 3:40 pm. Sending note to Audria Nine NP.      02/20/2023  Name: Jeremy Spence MRN: [id number] DOB: January 29, 1966  Today's TOC FU Call Status: Today's TOC FU Call Status:: Successful TOC FU Call Completed Unsuccessful Call (1st Attempt) Date: 02/16/23 [facility] FU Call Complete Date: 02/20/23 Patient's Name and Date of Birth confirmed.  Transition Care Management Follow-up Telephone Call Date of Discharge: 02/11/23 Discharge Facility: Other ([facility]) Name of Other ([facility]) Discharge Facility: [facility] Type of Discharge: Emergency Department Reason for ED Visit: Other: (pain in lower lt back and dx sciatica. pt seern [facility] ED.) How have you been since you were released from the hospital?: Better Any questions or concerns?: No  Items Reviewed: Did you receive and understand the discharge instructions provided?: Yes Medications obtained,verified, and reconciled?: Yes (Medications Reviewed) (pt not on regular daily meds.) Any new allergies since your discharge?: No Dietary orders reviewed?: NA Do you have support at home?: Yes People in Home: significant other Name of Support/Comfort Primary Source: Robin  Medications Reviewed Today: Medications Reviewed Today     Reviewed by Patience Musca, LPN (Licensed Practical Nurse) on 02/20/23 at 1646  Med List Status: <None>   Medication Order Taking? Sig Documenting Provider Last Dose Status Informant           No Medications to Display                            Home Care and Equipment/Supplies: Were Home Health Services Ordered?: NA Any new equipment or medical supplies  ordered?: NA  Functional Questionnaire: Do you need assistance with bathing/showering or dressing?: No (pt doing exercises given at ED and pt has returnedf to work.) Do you need assistance with meal preparation?: No Do you need assistance with eating?: No Do you have difficulty maintaining continence: No Do you need assistance with getting out of bed/getting out of a chair/moving?: No Do you have difficulty managing or taking your medications?: No  Follow up appointments reviewed: PCP Follow-up appointment confirmed?: Yes Date of PCP follow-up appointment?: 02/23/23 Follow-up Provider: Audria Nine NP [facility] Follow-up appointment confirmed?: NA Do you need transportation to your follow-up appointment?: No Do you understand care options if your condition(s) worsen?: Yes-patient verbalized understanding    SIGNATURE Lewanda Rife, LPN

## 2023-02-20 NOTE — Telephone Encounter (Signed)
Patient returned TOC that he missed on 02/16/2023.He would like a return call to complete this call.

## 2023-02-21 NOTE — Telephone Encounter (Signed)
noted 

## 2023-02-23 ENCOUNTER — Encounter: Payer: Self-pay | Admitting: Nurse Practitioner

## 2023-02-23 ENCOUNTER — Ambulatory Visit: Payer: 59 | Admitting: Nurse Practitioner

## 2023-02-23 VITALS — BP 128/86 | HR 70 | Temp 98.2°F | Ht 67.0 in | Wt 200.0 lb

## 2023-02-23 DIAGNOSIS — M5432 Sciatica, left side: Secondary | ICD-10-CM | POA: Insufficient documentation

## 2023-02-23 MED ORDER — GABAPENTIN 100 MG PO CAPS: 100.0000 mg | ORAL_CAPSULE | Freq: Three times a day (TID) | ORAL | 0 refills | Status: DC

## 2023-02-23 NOTE — Patient Instructions (Signed)
Nice to see you today I have sent in some gabapentin to try. It can cause sedation so use caution. Follow up with me in 1 month, sooner if you need me

## 2023-02-23 NOTE — Assessment & Plan Note (Signed)
Patient was seen in the emergency department had lumbar spine picture done did review.  Patient is tried Toradol, diazepam, steroids with little relief.  Will give patient sciatica rehab exercises ambulatory referral to physical therapy as has not heard from the referral from the emergency department.  Will do gabapentin 100 mg 3 times daily patient can start slower with 1 1 a day sedation precautions reviewed.  If no improvement within the next 4 weeks consider MRI as it has been going on since 01/23/2023 and patient will have 8 weeks of conservative therapy

## 2023-02-23 NOTE — Progress Notes (Signed)
Established Patient Office Visit  Subjective   Patient ID: Jeremy Spence, adult    DOB: 1966/01/19  Age: 57 y.o. MRN: [id number]  Chief Complaint  Patient presents with   Follow-up    Pt complains of sciatica moving down left leg. States pain is only slightly better. Pain level 3-4 Pt states the medication given at ED didn't help much. States prednisone helped with his back.     HPI  Hospital follow-up: Patient was seen in the emergency department on 02/11/2023 for acute left-sided low back pain with sciatica.  Patient underwent an x-ray that showed no acute abnormality.  Patient was placed on steroid, Toradol and Valium as needed.  She was given some exercises at home.  Patient was seen the month prior in the emergency department and physical therapy referral has been made patient is here for follow-up today  States that it is shotting and sometimes electric pain that is all the time if he is weight bearing or moving. No saddle anesthesia or B&B    Review of Systems  Constitutional:  Negative for chills and fever.  Respiratory:  Negative for shortness of breath.   Cardiovascular:  Negative for chest pain.  Genitourinary:        Negative bowel and bladder involvement.  Neurological:  Negative for tingling, weakness and headaches.      Objective:     BP 128/86   Pulse 70   Temp 98.2 F (36.8 C) (Oral)   Ht 5\' 7"  (1.702 m)   Wt 200 lb (90.7 kg)   SpO2 97%   BMI 31.32 kg/m  BP Readings from Last 3 Encounters:  02/23/23 128/86  07/29/21 118/85  06/09/21 112/88   Wt Readings from Last 3 Encounters:  02/23/23 200 lb (90.7 kg)  07/29/21 210 lb 6.9 oz (95.5 kg)  06/09/21 200 lb 1 oz (90.7 kg)      Physical Exam Vitals and nursing note reviewed.  Constitutional:      Appearance: Normal appearance.  Cardiovascular:     Rate and Rhythm: Normal rate and regular rhythm.     Heart sounds: Normal heart sounds.  Pulmonary:     Effort: Pulmonary effort is normal.      Breath sounds: Normal breath sounds.  Musculoskeletal:     Lumbar back: No tenderness or bony tenderness. Positive left straight leg raise test. Negative right straight leg raise test.  Neurological:     Mental Status: Jeremy Spence is alert.     Deep Tendon Reflexes:     Reflex Scores:      Patellar reflexes are 2+ on the right side and 2+ on the left side.    Comments: Bilateral lower extremity strength 5/5.      No results found for any visits on 02/23/23.    The 10-year ASCVD risk score (Arnett DK, et al., 2019) is: 6.9%    Assessment & Plan:   Problem List Items Addressed This Visit       Nervous and Auditory   Left sided sciatica - Primary    Patient was seen in the emergency department had lumbar spine picture done did review.  Patient is tried Toradol, diazepam, steroids with little relief.  Will give patient sciatica rehab exercises ambulatory referral to physical therapy as has not heard from the referral from the emergency department.  Will do gabapentin 100 mg 3 times daily patient can start slower with 1 1 a day sedation precautions reviewed.  If no improvement within  the next 4 weeks consider MRI as it has been going on since 01/23/2023 and patient will have 8 weeks of conservative therapy      Relevant Medications   gabapentin (NEURONTIN) 100 MG capsule   Other Relevant Orders   Ambulatory referral to Physical Therapy    Return in about 4 weeks (around 03/23/2023) for Sciatica and CPE with labs .    Audria Nine, NP

## 2023-03-23 ENCOUNTER — Encounter: Payer: Self-pay | Admitting: Nurse Practitioner

## 2023-03-23 ENCOUNTER — Ambulatory Visit: Payer: 59 | Admitting: Nurse Practitioner

## 2023-03-23 VITALS — BP 90/72 | HR 75 | Temp 98.2°F | Ht 67.0 in | Wt 198.8 lb

## 2023-03-23 DIAGNOSIS — Z114 Encounter for screening for human immunodeficiency virus [HIV]: Secondary | ICD-10-CM | POA: Diagnosis not present

## 2023-03-23 DIAGNOSIS — Z1159 Encounter for screening for other viral diseases: Secondary | ICD-10-CM

## 2023-03-23 DIAGNOSIS — Z13220 Encounter for screening for lipoid disorders: Secondary | ICD-10-CM

## 2023-03-23 DIAGNOSIS — Z0000 Encounter for general adult medical examination without abnormal findings: Secondary | ICD-10-CM | POA: Diagnosis not present

## 2023-03-23 DIAGNOSIS — E669 Obesity, unspecified: Secondary | ICD-10-CM | POA: Diagnosis not present

## 2023-03-23 DIAGNOSIS — M5432 Sciatica, left side: Secondary | ICD-10-CM

## 2023-03-23 DIAGNOSIS — Z125 Encounter for screening for malignant neoplasm of prostate: Secondary | ICD-10-CM

## 2023-03-23 MED ORDER — GABAPENTIN 100 MG PO CAPS: 100.0000 mg | ORAL_CAPSULE | Freq: Every day | ORAL | Status: AC

## 2023-03-23 NOTE — Assessment & Plan Note (Signed)
Pending TSH, lipid panel, A1c.

## 2023-03-23 NOTE — Patient Instructions (Signed)
Nice to see you today I will be in touch with the labs once I have reviewed them Follow up with me in 1 year, sooner if you need me

## 2023-03-23 NOTE — Assessment & Plan Note (Signed)
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  Did review patient's personal, surgical, social, family histories.  Patient is up-to-date with all age-appropriate vaccinations he would like.  He declined flu and shingles vaccine today.  Patient up-to-date on CRC screening according to patient and family.  PSA ordered for prostate cancer screening today.  Patient was given information at discharge about preventative healthcare maintenance with anticipatory guidance.

## 2023-03-23 NOTE — Assessment & Plan Note (Signed)
Patient is tolerating and doing well with gabapentin 100 mg nightly.  Continue

## 2023-03-23 NOTE — Progress Notes (Signed)
Established Patient Office Visit  Subjective   Patient ID: Jeremy Spence, male    DOB: 1965-07-08  Age: 57 y.o. MRN: [id number]  Chief Complaint  Patient presents with   Annual Exam    HPI   for complete physical and follow up of chronic conditions.   Sciatica: he is taking gabapentin 100mg  at bedtime and has been pain free  Immunizations: -Tetanus: Completed in within 10 -Influenza: refused  -Shingles: Refused -Pneumonia: Too young  Diet: Fair diet. 2 meals a day he will have a snacks. He will drink water and soda  Exercise: No regular exercise.  Eye exam: PRN Dental exam: Needs updated  Colonoscopy: Completed in 07/29/2021 Lung Cancer Screening: N/A  PSA: Due  Sleep: 9-930 and get up 515-530. Feels rested. Does snore       Review of Systems  Constitutional:  Negative for chills and fever.  Respiratory:  Negative for shortness of breath.   Cardiovascular:  Negative for chest pain and leg swelling. Orthopnea: Nocturia x1. Gastrointestinal:  Negative for abdominal pain, blood in stool, constipation, diarrhea, nausea and vomiting.       BM daily   Genitourinary:  Negative for dysuria and hematuria.  Neurological:  Negative for tingling and headaches.  Psychiatric/Behavioral:  Negative for hallucinations and suicidal ideas.       Objective:     BP 90/72   Pulse 75   Temp 98.2 F (36.8 C) (Oral)   Ht 5\' 7"  (1.702 m)   Wt 198 lb 12.8 oz (90.2 kg)   SpO2 95%   BMI 31.14 kg/m  BP Readings from Last 3 Encounters:  03/23/23 90/72  02/23/23 128/86  07/29/21 118/85   Wt Readings from Last 3 Encounters:  03/23/23 198 lb 12.8 oz (90.2 kg)  02/23/23 200 lb (90.7 kg)  07/29/21 210 lb 6.9 oz (95.5 kg)   SpO2 Readings from Last 3 Encounters:  03/23/23 95%  02/23/23 97%  07/29/21 98%      Physical Exam Vitals and nursing note reviewed.  Constitutional:      Appearance: Normal appearance.  HENT:     Right Ear: Tympanic membrane, ear canal and  external ear normal.     Left Ear: Tympanic membrane, ear canal and external ear normal.     Mouth/Throat:     Mouth: Mucous membranes are moist.     Pharynx: Oropharynx is clear.  Eyes:     Extraocular Movements: Extraocular movements intact.     Pupils: Pupils are equal, round, and reactive to light.  Cardiovascular:     Rate and Rhythm: Normal rate and regular rhythm.     Pulses: Normal pulses.     Heart sounds: Normal heart sounds.  Pulmonary:     Effort: Pulmonary effort is normal.     Breath sounds: Normal breath sounds.  Abdominal:     General: Bowel sounds are normal. There is no distension.     Palpations: There is no mass.     Tenderness: There is no abdominal tenderness.     Hernia: No hernia is present.  Musculoskeletal:     Right lower leg: No edema.     Left lower leg: No edema.  Lymphadenopathy:     Cervical: No cervical adenopathy.  Skin:    General: Skin is warm.  Neurological:     General: No focal deficit present.     Mental Status: He is alert.     Deep Tendon Reflexes:     Reflex  Scores:      Bicep reflexes are 2+ on the right side and 2+ on the left side.      Patellar reflexes are 2+ on the right side and 2+ on the left side.    Comments: Bilateral upper and lower extremity strength 5/5  Psychiatric:        Mood and Affect: Mood normal.        Behavior: Behavior normal.        Thought Content: Thought content normal.        Judgment: Judgment normal.      No results found for any visits on 03/23/23.    The 10-year ASCVD risk score (Arnett DK, et al., 2019) is: 3.8%    Assessment & Plan:   Problem List Items Addressed This Visit       Nervous and Auditory   Left sided sciatica   Patient is tolerating and doing well with gabapentin 100 mg nightly.  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  Did review patient's personal, surgical, social, family histories.  Patient is up-to-date with all age-appropriate vaccinations he would like.  He declined flu and shingles vaccine today.  Patient up-to-date on CRC screening according to patient and family.  PSA ordered for prostate cancer screening today.  Patient was given information at discharge about preventative healthcare maintenance with anticipatory guidance.      Relevant Orders   CBC   Comprehensive metabolic panel   TSH   Obesity (BMI 30-39.9)   Pending TSH, lipid panel, A1c.      Relevant Orders   Lipid panel   Hemoglobin A1c   Other Visit Diagnoses       Encounter for hepatitis C screening test for low risk patient       Relevant Orders   Hepatitis C Antibody     Encounter for screening for HIV       Relevant Orders   HIV antibody (with reflex)     Screening for lipid disorders       Relevant Orders   Lipid panel       Return in about 1 year (around 03/22/2024) for CPE and Labs.    Audria Nine, NP

## 2023-03-24 LAB — TSH: TSH: 0.35 m[IU]/L — ABNORMAL LOW (ref 0.40–4.50)

## 2023-03-24 LAB — COMPREHENSIVE METABOLIC PANEL
AG Ratio: 1.6 (calc) (ref 1.0–2.5)
ALT: 20 U/L (ref 9–46)
AST: 17 U/L (ref 10–35)
Albumin: 4.4 g/dL (ref 3.6–5.1)
Alkaline phosphatase (APISO): 88 U/L (ref 35–144)
BUN: 12 mg/dL (ref 7–25)
CO2: 26 mmol/L (ref 20–32)
Calcium: 9.1 mg/dL (ref 8.6–10.3)
Chloride: 104 mmol/L (ref 98–110)
Creat: 1.04 mg/dL (ref 0.70–1.30)
Globulin: 2.7 g/dL (ref 1.9–3.7)
Glucose, Bld: 81 mg/dL (ref 65–99)
Potassium: 3.9 mmol/L (ref 3.5–5.3)
Sodium: 141 mmol/L (ref 135–146)
Total Bilirubin: 1.2 mg/dL (ref 0.2–1.2)
Total Protein: 7.1 g/dL (ref 6.1–8.1)

## 2023-03-24 LAB — PSA: PSA: 0.63 ng/mL (ref <=4.00)

## 2023-03-24 LAB — LIPID PANEL
Cholesterol: 199 mg/dL (ref <200)
HDL: 30 mg/dL — ABNORMAL LOW (ref >=40)
LDL Cholesterol (Calc): 131 mg/dL — ABNORMAL HIGH
Non-HDL Cholesterol (Calc): 169 mg/dL — ABNORMAL HIGH (ref <130)
Total CHOL/HDL Ratio: 6.6 (calc) — ABNORMAL HIGH (ref <5.0)
Triglycerides: 236 mg/dL — ABNORMAL HIGH (ref <150)

## 2023-03-24 LAB — HEPATITIS C ANTIBODY: Hepatitis C Ab: NONREACTIVE

## 2023-03-24 LAB — CBC
HCT: 45.1 % (ref 38.5–50.0)
Hemoglobin: 15.4 g/dL (ref 13.2–17.1)
MCH: 32.2 pg (ref 27.0–33.0)
MCHC: 34.1 g/dL (ref 32.0–36.0)
MCV: 94.2 fL (ref 80.0–100.0)
MPV: 11 fL (ref 7.5–12.5)
Platelets: 281 10*3/uL (ref 140–400)
RBC: 4.79 10*6/uL (ref 4.20–5.80)
RDW: 11.6 % (ref 11.0–15.0)
WBC: 6.1 10*3/uL (ref 3.8–10.8)

## 2023-03-24 LAB — HEMOGLOBIN A1C
Hgb A1c MFr Bld: 5.3 %{Hb} (ref <5.7)
Mean Plasma Glucose: 105 mg/dL
eAG (mmol/L): 5.8 mmol/L

## 2023-03-24 LAB — HIV ANTIBODY (ROUTINE TESTING W REFLEX): HIV 1&2 Ab, 4th Generation: NONREACTIVE

## 2023-03-28 ENCOUNTER — Other Ambulatory Visit: Payer: Self-pay | Admitting: Nurse Practitioner

## 2023-03-28 DIAGNOSIS — R946 Abnormal results of thyroid function studies: Secondary | ICD-10-CM

## 2023-04-13 ENCOUNTER — Other Ambulatory Visit: Payer: Self-pay

## 2023-04-20 ENCOUNTER — Other Ambulatory Visit (INDEPENDENT_AMBULATORY_CARE_PROVIDER_SITE_OTHER): Payer: Self-pay

## 2023-04-20 DIAGNOSIS — R946 Abnormal results of thyroid function studies: Secondary | ICD-10-CM

## 2023-04-20 NOTE — Addendum Note (Signed)
Addended by: Lovena Neighbours on: 04/20/2023 03:53 PM   Modules accepted: Orders

## 2023-04-21 LAB — T4, FREE: Free T4: 1.2 ng/dL (ref 0.8–1.8)

## 2023-04-21 LAB — TSH: TSH: 0.57 m[IU]/L (ref 0.40–4.50)

## 2023-04-21 LAB — T3, FREE: T3, Free: 3.8 pg/mL (ref 2.3–4.2)

## 2023-04-24 ENCOUNTER — Encounter: Payer: Self-pay | Admitting: Nurse Practitioner
